# Patient Record
Sex: Male | Born: 1965 | Race: White | Hispanic: No | Smoking: Never smoker
Health system: Southern US, Community
[De-identification: ages and names within clinical notes are randomized; demographics above are authoritative.]

## PROBLEM LIST (undated history)

## (undated) DIAGNOSIS — B019 Varicella without complication: Secondary | ICD-10-CM

## (undated) DIAGNOSIS — C801 Malignant (primary) neoplasm, unspecified: Secondary | ICD-10-CM

## (undated) DIAGNOSIS — I82402 Acute embolism and thrombosis of unspecified deep veins of left lower extremity: Secondary | ICD-10-CM

## (undated) DIAGNOSIS — I2699 Other pulmonary embolism without acute cor pulmonale: Secondary | ICD-10-CM

## (undated) DIAGNOSIS — D689 Coagulation defect, unspecified: Secondary | ICD-10-CM

## (undated) DIAGNOSIS — G4730 Sleep apnea, unspecified: Secondary | ICD-10-CM

## (undated) DIAGNOSIS — I739 Peripheral vascular disease, unspecified: Secondary | ICD-10-CM

## (undated) DIAGNOSIS — I10 Essential (primary) hypertension: Secondary | ICD-10-CM

## (undated) DIAGNOSIS — M109 Gout, unspecified: Secondary | ICD-10-CM

## (undated) HISTORY — DX: Other pulmonary embolism without acute cor pulmonale: I26.99

## (undated) HISTORY — PX: LASIK: SHX215

## (undated) HISTORY — PX: COLONOSCOPY: SHX174

## (undated) HISTORY — DX: Sleep apnea, unspecified: G47.30

## (undated) HISTORY — DX: Coagulation defect, unspecified: D68.9

## (undated) HISTORY — DX: Acute embolism and thrombosis of unspecified deep veins of left lower extremity: I82.402

## (undated) HISTORY — PX: HERNIA REPAIR: SHX51

## (undated) HISTORY — PX: WISDOM TOOTH EXTRACTION: SHX21

## (undated) HISTORY — DX: Varicella without complication: B01.9

---

## 1994-10-13 HISTORY — PX: NASAL SEPTUM SURGERY: SHX37

## 2000-10-13 HISTORY — PX: CHOLECYSTECTOMY: SHX55

## 2001-10-11 ENCOUNTER — Observation Stay (HOSPITAL_COMMUNITY): Admission: EM | Admit: 2001-10-11 | Discharge: 2001-10-12 | Payer: Self-pay | Admitting: Emergency Medicine

## 2001-10-11 ENCOUNTER — Encounter (INDEPENDENT_AMBULATORY_CARE_PROVIDER_SITE_OTHER): Payer: Self-pay | Admitting: Specialist

## 2001-10-11 ENCOUNTER — Encounter: Payer: Self-pay | Admitting: Emergency Medicine

## 2001-10-13 HISTORY — PX: APPENDECTOMY: SHX54

## 2002-05-17 ENCOUNTER — Emergency Department (HOSPITAL_COMMUNITY): Admission: EM | Admit: 2002-05-17 | Discharge: 2002-05-18 | Payer: Self-pay | Admitting: Emergency Medicine

## 2002-10-11 ENCOUNTER — Encounter (INDEPENDENT_AMBULATORY_CARE_PROVIDER_SITE_OTHER): Payer: Self-pay | Admitting: Specialist

## 2002-10-12 ENCOUNTER — Encounter: Payer: Self-pay | Admitting: Family Medicine

## 2002-10-12 ENCOUNTER — Inpatient Hospital Stay (HOSPITAL_COMMUNITY): Admission: EM | Admit: 2002-10-12 | Discharge: 2002-10-13 | Payer: Self-pay | Admitting: Emergency Medicine

## 2005-07-31 ENCOUNTER — Ambulatory Visit: Payer: Self-pay | Admitting: Family Medicine

## 2005-08-01 ENCOUNTER — Encounter: Admission: RE | Admit: 2005-08-01 | Discharge: 2005-08-01 | Payer: Self-pay | Admitting: Family Medicine

## 2005-09-15 ENCOUNTER — Ambulatory Visit: Payer: Self-pay | Admitting: Family Medicine

## 2007-06-05 ENCOUNTER — Emergency Department (HOSPITAL_COMMUNITY): Admission: EM | Admit: 2007-06-05 | Discharge: 2007-06-05 | Payer: Self-pay | Admitting: Emergency Medicine

## 2008-11-18 ENCOUNTER — Emergency Department (HOSPITAL_BASED_OUTPATIENT_CLINIC_OR_DEPARTMENT_OTHER): Admission: EM | Admit: 2008-11-18 | Discharge: 2008-11-18 | Payer: Self-pay | Admitting: Emergency Medicine

## 2009-11-07 ENCOUNTER — Encounter: Admission: RE | Admit: 2009-11-07 | Discharge: 2010-02-05 | Payer: Self-pay | Admitting: Family Medicine

## 2011-01-22 ENCOUNTER — Emergency Department (HOSPITAL_BASED_OUTPATIENT_CLINIC_OR_DEPARTMENT_OTHER)
Admission: EM | Admit: 2011-01-22 | Discharge: 2011-01-22 | Disposition: A | Discharge status: Home / Self Care | Payer: BLUE CROSS/BLUE SHIELD | Attending: Emergency Medicine | Admitting: Emergency Medicine

## 2011-01-22 ENCOUNTER — Emergency Department (INDEPENDENT_AMBULATORY_CARE_PROVIDER_SITE_OTHER): Payer: BLUE CROSS/BLUE SHIELD

## 2011-01-22 DIAGNOSIS — M7020 Olecranon bursitis, unspecified elbow: Secondary | ICD-10-CM | POA: Insufficient documentation

## 2011-01-22 DIAGNOSIS — M25529 Pain in unspecified elbow: Secondary | ICD-10-CM | POA: Insufficient documentation

## 2011-02-28 NOTE — Op Note (Signed)
[facility]  Patient:    Jonathon [name], Jonathon [name] Visit Number: [id number] MRN: [id number]          Service Type: SUR Location: 3W [id number] [id number] Attending Physician:  Delsa Bern Dictated by:   Lorne Skeens. Hoxworth, M.D. Proc. Date: 10/11/01 Admit Date:  10/11/2001                             Operative Report  PREOPERATIVE DIAGNOSES: 1. Cholelithiasis. 2. Acute cholecystitis.  POSTOPERATIVE DIAGNOSES: 1. Cholelithiasis. 2. Acute cholecystitis.  SURGICAL PROCEDURES:  Laparoscopic cholecystectomy.  SURGEON:  Lorne Skeens. Hoxworth, M.D.  ASSISTANTRiley Lam A. Magnus Ivan, M.D.  ANESTHESIA:  General.  BRIEF HISTORY:  [name] is a 45 year old male, who presents with acute back and right upper quadrant abdominal pain of approximately 12 hours duration.  Ultrasound has shown gallstones.  White count is elevated, and he remains moderately tender.  Urgent laparoscopic cholecystectomy has been recommended and accepted.  The nature of the procedure, its indications, risks of bleeding, infection, bile leak and bile duct injury were discussed and understood preoperatively.  He is now brought to the operating room for this procedure.  DESCRIPTION OF OPERATION:  The patient brought to the operating room and placed in the supine position on the operating table, and general endotracheal anesthesia was induced.  He received preoperative antibiotics.  PAS were in place.  The abdomen was sterilely prepped and draped.  Local anesthesia was used to infiltrate the trocar sites prior to the incisions.  A 1 cm incision was made at the umbilicus and dissection carried down the midline fascia which was sharply incised for 1 cm and the peritoneum entered under direct vision. Through a mattress suture of 0 Vicryl, the Hasson trocar was placed and pneumoperitoneum established.  Under direct vision, a 10 mm trocar was placed in the subxiphoid area and two 5  mm trocars on the right subcostal margin. The gallbladder was visualized and was acutely inflamed and edematous with early gangrenous changes.  The fundus was grasped and elevated up over the liver, and the infundibulum retracted inferolaterally.  Edematous fibrofatty tissue was stripped off the neck of the gallbladder toward the porta hepatis. The distal gallbladder was exposed, and the cystic duct identified and the cystic duct gallbladder junction dissected 360 degrees.  When the anatomy was clear, the cystic duct was doubly clipped proximally, clipped distally and divided.  Further dissection of Calots triangle revealed the cystic artery coursing up on the gallbladder wall and was divided between two proximal and one distal clips.  The gallbladder was then dissected free from its bed using hook cautery and removed through the umbilicus.  Hemostasis was assured in the gallbladder bed.  The right upper quadrant was thoroughly irrigated and suctioned until clear.  The trocars removed under direct vision and all CO2 evacuated from the peritoneal cavity.  The pursestring suture was secured at the umbilicus.  Skin incisions were closed with interrupted subcuticular 4-0 Monocryl and Steri-Strips.  Sponge, needle, and instrument counts were correct.  Dry sterile dressings applied and the patient was taken to recovery in satisfactory condition.procedure well. Dictated by:   Lorne Skeens. Hoxworth, M.D. Attending Physician:  Delsa Bern DD:  10/11/01 TD:  10/11/01 Job: [id number]

## 2011-02-28 NOTE — Op Note (Signed)
[name], [name]                      ACCOUNT NO.:  [id number]   MEDICAL RECORD NO.:  [id number]                   PATIENT TYPE:  INP   LOCATION:  1829                                 FACILITY:  [facility]   PHYSICIAN:  Thornton Park. Daphine Deutscher, M.D.             DATE OF BIRTH:  05/25/1966   DATE OF PROCEDURE:  DATE OF DISCHARGE:                                 OPERATIVE REPORT   PREOPERATIVE DIAGNOSIS:  Acute appendicitis.   POSTOPERATIVE DIAGNOSES:  Acute appendicitis.   PROCEDURE:  Laparoscopically appendectomy.   SURGEON:  Thornton Park. Daphine Deutscher, M.D.   ANESTHESIA:  General endotracheal anesthesia.   INDICATIONS FOR PROCEDURE:  The patient is a 45 year-old gentleman sent over  from Dr. Alonza Smoker this morning with about a nine hour history of right  lower quadrant abdominal pain.  The patient was seen in the ER where his  white count was noted to be 12,000 and he had pain at McBurney's point.  Informed consent was obtained regarding laparoscopic as well as open  appendectomy.   DESCRIPTION OF PROCEDURE:  The patient was taken to room 17 emergently at  [name] and given general anesthesia.  The abdomen was prepped  with Betadine and draped sterilely.  Preoperatively he received 3 grams of  Unasyn.  I incised a previous transverse incision in the umbilicus and  entered the abdomen without difficulty using a Hasson cannula.  The abdomen  was insufflated and the appendix area was visualized.  The appendix had a  picture taken of it which is in the chart which showed it to be purple with  pus surrounding it.  It did not appear to be perforated, however. A 5 mm was  placed in the right upper quadrant through a previous old laparoscopic  cholecystectomy incision and then a 10-11 in the left lower quadrant.  The  10-11 in the left lower quadrant was placed obliquely with a non-bladed  trocar.  The scope was moved to the left lower quadrant and I operated out  of the umbilical  port and the right upper quadrant port.  With these, I went  ahead and mobilized the appendix and transected the base with the vascular  Endo GIA.  I then transected the mesentery with the vascular Endo GIA and  then an arterial bleeder was noted along the staple line.  This was grasped  with the harmonic scalpel and was coagulated.  In addition, two clips were  placed on this.  No bleeding was then seen and I irrigated copiously and  went back and examined this area at the end of the case.   In the meantime, I went ahead and placed the appendix in a bag and brought  it out through the umbilicus without difficulty.   The port sites were injected with 0.5% Marcaine.  The umbilical port was  repaired with 2-0 Vicryl sutures placed horizontally through the fascia.  This  area was then inspected through the left lower quadrant port and a good  closure was present.  I reinspected the appendiceal region once again and  also withdrew any irrigant that had built up by placing the patient with the  head up and aspirating the pelvis.   The abdomen was then deflated and the two remaining trocars were withdrawn.  The wounds were closed with 4-0 Vicryl subcuticularly with Benzoin and Steri-  Strips.  The patient tolerated the procedure well and was taken to the  recovery room in satisfactory condition.                                               Thornton Park Daphine Deutscher, M.D.    MBM/MEDQ  D:  10/12/2002  T:  10/12/2002  Job:  [id number]   cc:   Tinnie Gens A. Tawanna Cooler, M.D. [facility]  [address] [place]  [place] [address]  Fax: 1

## 2016-07-18 ENCOUNTER — Encounter (HOSPITAL_COMMUNITY): Payer: Self-pay

## 2016-07-18 ENCOUNTER — Inpatient Hospital Stay (HOSPITAL_COMMUNITY)
Admission: EM | Admit: 2016-07-18 | Discharge: 2016-07-20 | DRG: 175 | Disposition: A | Discharge status: Home / Self Care | Payer: Commercial Managed Care - HMO | Attending: Internal Medicine | Admitting: Internal Medicine

## 2016-07-18 ENCOUNTER — Emergency Department (HOSPITAL_COMMUNITY): Payer: Commercial Managed Care - HMO

## 2016-07-18 DIAGNOSIS — E6609 Other obesity due to excess calories: Secondary | ICD-10-CM | POA: Diagnosis not present

## 2016-07-18 DIAGNOSIS — Z6838 Body mass index (BMI) 38.0-38.9, adult: Secondary | ICD-10-CM

## 2016-07-18 DIAGNOSIS — I2602 Saddle embolus of pulmonary artery with acute cor pulmonale: Secondary | ICD-10-CM | POA: Diagnosis not present

## 2016-07-18 DIAGNOSIS — E669 Obesity, unspecified: Secondary | ICD-10-CM | POA: Diagnosis present

## 2016-07-18 DIAGNOSIS — E860 Dehydration: Secondary | ICD-10-CM | POA: Diagnosis present

## 2016-07-18 DIAGNOSIS — Z79899 Other long term (current) drug therapy: Secondary | ICD-10-CM

## 2016-07-18 DIAGNOSIS — G4733 Obstructive sleep apnea (adult) (pediatric): Secondary | ICD-10-CM | POA: Diagnosis present

## 2016-07-18 DIAGNOSIS — M109 Gout, unspecified: Secondary | ICD-10-CM | POA: Diagnosis not present

## 2016-07-18 DIAGNOSIS — I10 Essential (primary) hypertension: Secondary | ICD-10-CM | POA: Diagnosis not present

## 2016-07-18 DIAGNOSIS — I2699 Other pulmonary embolism without acute cor pulmonale: Secondary | ICD-10-CM

## 2016-07-18 DIAGNOSIS — R0602 Shortness of breath: Secondary | ICD-10-CM

## 2016-07-18 DIAGNOSIS — I82412 Acute embolism and thrombosis of left femoral vein: Secondary | ICD-10-CM | POA: Diagnosis not present

## 2016-07-18 DIAGNOSIS — R7989 Other specified abnormal findings of blood chemistry: Secondary | ICD-10-CM | POA: Diagnosis not present

## 2016-07-18 DIAGNOSIS — Z86711 Personal history of pulmonary embolism: Secondary | ICD-10-CM | POA: Diagnosis present

## 2016-07-18 HISTORY — DX: Other pulmonary embolism without acute cor pulmonale: I26.99

## 2016-07-18 HISTORY — DX: Gout, unspecified: M10.9

## 2016-07-18 LAB — BASIC METABOLIC PANEL
Anion gap: 9 (ref 5–15)
BUN: 19 mg/dL (ref 6–20)
CALCIUM: 9.5 mg/dL (ref 8.9–10.3)
CO2: 25 mmol/L (ref 22–32)
Chloride: 106 mmol/L (ref 101–111)
Creatinine, Ser: 1.11 mg/dL (ref 0.61–1.24)
GFR calc Af Amer: >60 mL/min (ref >60)
GLUCOSE: 90 mg/dL (ref 65–99)
Potassium: 3.9 mmol/L (ref 3.5–5.1)
Sodium: 140 mmol/L (ref 135–145)

## 2016-07-18 LAB — TROPONIN I: Troponin I: <0.03 ng/mL (ref <0.03)

## 2016-07-18 LAB — CBC
HCT: 44.8 % (ref 39.0–52.0)
HEMOGLOBIN: 15.1 g/dL (ref 13.0–17.0)
MCH: 30.3 pg (ref 26.0–34.0)
MCHC: 33.7 g/dL (ref 30.0–36.0)
MCV: 90 fL (ref 78.0–100.0)
Platelets: 174 10*3/uL (ref 150–400)
RBC: 4.98 MIL/uL (ref 4.22–5.81)
RDW: 13.3 % (ref 11.5–15.5)
WBC: 7.6 10*3/uL (ref 4.0–10.5)

## 2016-07-18 LAB — PROTIME-INR
INR: 0.89
PROTHROMBIN TIME: 12.1 s (ref 11.4–15.2)

## 2016-07-18 LAB — BRAIN NATRIURETIC PEPTIDE: B NATRIURETIC PEPTIDE 5: 10 pg/mL (ref 0.0–100.0)

## 2016-07-18 LAB — D-DIMER, QUANTITATIVE: D-Dimer, Quant: 5.29 ug/mL-FEU — ABNORMAL HIGH (ref 0.00–0.50)

## 2016-07-18 LAB — I-STAT TROPONIN, ED: TROPONIN I, POC: 0 ng/mL (ref 0.00–0.08)

## 2016-07-18 MED ORDER — IOPAMIDOL (ISOVUE-370) INJECTION 76%
100.0000 mL | Freq: Once | INTRAVENOUS | Status: AC | PRN
  Administered 2016-07-18: 100 mL via INTRAVENOUS

## 2016-07-18 MED ORDER — HEPARIN BOLUS VIA INFUSION
5000.0000 [IU] | Freq: Once | INTRAVENOUS | Status: AC
  Administered 2016-07-18: 5000 [IU] via INTRAVENOUS
  Filled 2016-07-18: qty 5000

## 2016-07-18 MED ORDER — HEPARIN (PORCINE) IN NACL 100-0.45 UNIT/ML-% IJ SOLN
1700.0000 [IU]/h | INTRAMUSCULAR | Status: DC
  Administered 2016-07-18 – 2016-07-20 (×3): 1700 [IU]/h via INTRAVENOUS
  Filled 2016-07-18 (×3): qty 250

## 2016-07-18 NOTE — Progress Notes (Signed)
ANTICOAGULATION CONSULT NOTE - Initial Consult  Pharmacy Consult for Heparin Indication: pulmonary embolus  No Known Allergies  Patient Measurements: Height: 5\' 10"  (177.8 cm) Weight: 239 lb (108.4 kg) IBW/kg (Calculated) : 73 HEPARIN DW (KG): 96.4  Vital Signs: Temp: 98.3 F (36.8 C) (10/06 1923) Temp Source: Oral (10/06 1923) BP: 142/93 (10/06 1923) Pulse Rate: 85 (10/06 1923)  Labs:  Recent Labs  07/18/16 1627  HGB 15.1  HCT 44.8  PLT 174  CREATININE 1.11  TROPONINI <0.03    Estimated Creatinine Clearance: 98.2 mL/min (by C-G formula based on SCr of 1.11 mg/dL).   Medical History: Past Medical History:  Diagnosis Date  . Gout     Medications:  Infusions:   Assessment: 50 yo M with new PE.  CBC reviewed.    Goal of Therapy:  Heparin level 0.3-0.7 units/ml Monitor platelets by anticoagulation protocol: Yes   Plan:  Give 5000 units bolus x 1 Start heparin infusion at 1700 units/hr Check anti-Xa level in 6 hours and daily while on heparin Continue to monitor H&H and platelets  Leveta Wahab, Lavonia Drafts 07/18/2016,9:57 PM

## 2016-07-18 NOTE — ED Triage Notes (Signed)
Pt presents to the ED c/o SOB and dizziness on exertion x 2-3 weeks. Pt reports he becomes SOB after walking up the flights of stairs on his way to work. Denies CP or syncope. NAD noted at this time.

## 2016-07-18 NOTE — ED Provider Notes (Addendum)
[facility] DEPT Provider Note   CSN: [id number] Arrival date & time: 07/18/16  1428     History   Chief Complaint No chief complaint on file.   HPI ZINEDINE MAJID is a 50 y.o. male.  The history is provided by the patient.  Shortness of Breath  This is a new problem. The average episode lasts 2 weeks. The problem occurs frequently.The problem has been gradually worsening. Associated symptoms include leg swelling. Pertinent negatives include no fever, no sore throat, no ear pain, no cough, no sputum production, no chest pain, no vomiting, no abdominal pain and no rash. It is unknown what precipitated the problem. Risk factors include recent prolonged sitting. He has tried nothing for the symptoms. Associated medical issues do not include asthma, COPD, pneumonia, PE, CAD, heart failure, past MI or DVT.   Reports travel in May resulting in LLE swelling that was seen by PCP, who thought it might be gout and given steroids that improved swelling. No DVT US obtained. Also reports multiple long car rides of approx 10hrs in the last several months   Past Medical History:  Diagnosis Date  . Gout     Patient Active Problem List   Diagnosis Date Noted  . Pulmonary embolism ([place]) 07/18/2016    History reviewed. No pertinent surgical history.     Home Medications    Prior to Admission medications   Medication Sig Start Date End Date Taking? Authorizing Provider  allopurinol (ZYLOPRIM) 300 MG tablet Take 300 mg by mouth daily.   Yes Historical Provider, MD  naproxen sodium (ANAPROX) 220 MG tablet Take 440 mg by mouth 2 (two) times daily as needed (pain).   Yes Historical Provider, MD    Family History Family History  Problem Relation Age of Onset  . Heart attack Father     Social History Social History  Substance Use Topics  . Smoking status: Never Smoker  . Smokeless tobacco: Never Used  . Alcohol use 3.0 - 3.6 oz/week    5 - 6 Cans of beer per week      Allergies   Review of patient's allergies indicates no known allergies.   Review of Systems Review of Systems  Constitutional: Negative for chills and fever.  HENT: Negative for ear pain and sore throat.   Eyes: Negative for pain and visual disturbance.  Respiratory: Positive for shortness of breath. Negative for cough and sputum production.   Cardiovascular: Positive for leg swelling. Negative for chest pain and palpitations.  Gastrointestinal: Negative for abdominal pain and vomiting.  Genitourinary: Negative for dysuria and hematuria.  Musculoskeletal: Negative for arthralgias and back pain.  Skin: Negative for color change and rash.  Neurological: Negative for seizures and syncope.  All other systems reviewed and are negative.    Physical Exam Updated Vital Signs BP 142/100 (BP Location: Right Arm)   Pulse 84   Temp 98.3 F (36.8 C) (Oral)   Resp 20   Ht 5\' 10"  (1.778 m)   Wt 239 lb (108.4 kg)   SpO2 95%   BMI 34.29 kg/m   Physical Exam  Constitutional: He is oriented to person, place, and time. He appears well-developed and well-nourished. No distress.  HENT:  Head: Normocephalic and atraumatic.  Nose: Nose normal.  Eyes: Conjunctivae and EOM are normal. Pupils are equal, round, and reactive to light. Right eye exhibits no discharge. Left eye exhibits no discharge. No scleral icterus.  Neck: Normal range of motion. Neck supple.  Cardiovascular: Normal rate  and regular rhythm.  Exam reveals no gallop and no friction rub.   No murmur heard. Pulmonary/Chest: Effort normal and breath sounds normal. No stridor. No respiratory distress. He has no rales.  Abdominal: Soft. He exhibits no distension. There is no tenderness.  Musculoskeletal: He exhibits no edema or tenderness.  BLE edema L>R  Neurological: He is alert and oriented to person, place, and time.  Skin: Skin is warm and dry. No rash noted. He is not diaphoretic. No erythema.  Psychiatric: He has a  normal mood and affect.  Vitals reviewed.    ED Treatments / Results  Labs (all labs ordered are listed, but only abnormal results are displayed) Labs Reviewed  D-DIMER, QUANTITATIVE (NOT AT [facility]) - Abnormal; Notable for the following:       Result Value   D-Dimer, Quant 5.29 (*)    All other components within normal limits  BASIC METABOLIC PANEL  CBC  TROPONIN I  BRAIN NATRIURETIC PEPTIDE  CBC  PROTIME-INR  I-STAT TROPOININ, ED    EKG  EKG Interpretation None       Radiology Dg Chest 2 View  Result Date: 07/18/2016 CLINICAL DATA:  Initial evaluation for acute shortness of breath, dizziness on exertion. EXAM: CHEST  2 VIEW COMPARISON:  None available. FINDINGS: The heart size and mediastinal contours are within normal limits. Both lungs are clear. The visualized skeletal structures are unremarkable. IMPRESSION: No active cardiopulmonary disease. Electronically Signed   By: Jeannine Boga M.D.   On: 07/18/2016 18:51   Ct Angio Chest Pe W Or Wo Contrast  Result Date: 07/18/2016 CLINICAL DATA:  Shortness of breath and dizziness. EXAM: CT ANGIOGRAPHY CHEST WITH CONTRAST TECHNIQUE: Multidetector CT imaging of the chest was performed using the standard protocol during bolus administration of intravenous contrast. Multiplanar CT image reconstructions and MIPs were obtained to evaluate the vascular anatomy. CONTRAST:  100 mL Isovue 370 IV COMPARISON:  Chest radiograph 07/18/2016 FINDINGS: Cardiovascular: There is satisfactory opacification of the pulmonary arteries to the segmental level. There are bilateral pulmonary emboli within the right and left pulmonary arteries, extending into the upper and lower lobar and segmental arteries on the right and into the upper lobar and posterior upper lobe segmental, lingular and lower lobar and segmental arteries on the right. The main pulmonary artery is within normal limits for size, measuring 2.7 cm at the bifurcation. The RV/LV ratio is  calculated to be 0.9, suggesting right heart strain. The visualized aorta is normal. There is a normal 3-vessel arch branching pattern. Heart size is normal, without pericardial effusion. Mediastinum/Nodes: No mediastinal, hilar or axillary lymphadenopathy. The visualized thyroid and thoracic esophageal course are unremarkable. Lungs/Pleura: Lungs are clear. No pleural effusion or pneumothorax. Upper Abdomen: Contrast bolus timing is not optimized for evaluation of the abdominal organs. Within this limitation, the visualized organs of the upper abdomen are normal. Musculoskeletal: No chest wall abnormality. No acute or significant osseous findings. Review of the MIP images confirms the above findings. IMPRESSION: Acute pulmonary embolism within the bilateral pulmonary artery's, extending into the lobar and segmental branches with CT evidence of right heart strain (RV/LV Ratio = 0.9) consistent with at least submassive (intermediate risk) PE. The presence of right heart strain has been associated with an increased risk of morbidity and mortality. Please activate Code PE by paging [phone]. Critical Value/emergent results were called by telephone at the time of interpretation on 07/18/2016 at 10:15 pm to Dr. Addison Lank , who verbally acknowledged these results. Electronically Signed  By: Ulyses Jarred M.D.   On: 07/18/2016 22:18    Procedures Procedures (including critical care time)  CRITICAL CARE Performed by: Grayce Sessions Leeum Sankey Total critical care time: 40 minutes Critical care time was exclusive of separately billable procedures and treating other patients. Critical care was necessary to treat or prevent imminent or life-threatening deterioration. Critical care was time spent personally by me on the following activities: development of treatment plan with patient and/or surrogate as well as nursing, discussions with consultants, evaluation of patient's response to treatment, examination of  patient, obtaining history from patient or surrogate, ordering and performing treatments and interventions, ordering and review of laboratory studies, ordering and review of radiographic studies, pulse oximetry and re-evaluation of patient's condition.   Medications Ordered in ED Medications  heparin bolus via infusion 5,000 Units (not administered)  heparin ADULT infusion 100 units/mL (25000 units/264mL sodium chloride 0.45%) (not administered)  iopamidol (ISOVUE-370) 76 % injection 100 mL (100 mLs Intravenous Contrast Given 07/18/16 2132)     Initial Impression / Assessment and Plan / ED Course  I have reviewed the triage vital signs and the nursing notes.  Pertinent labs & imaging results that were available during my care of the patient were reviewed by me and considered in my medical decision making (see chart for details).  Clinical Course  Value Comment By Time  D-Dimer, Quant: (!) 5.29 (Reviewed) Fatima Blank, MD 10/06 2019    EKG with S1Q3T3. CT confirmed bilateral pulmonary emboli with possible right heart strain. Patient's blood pressure and saturations are stable. Started on heparin gtt. Will require admission for continued workup and management.  Appreciate hospitalist admission.  Final Clinical Impressions(s) / ED Diagnoses   Final diagnoses:  SOB (shortness of breath)  Acute saddle pulmonary embolism with acute cor pulmonale ([place])    Disposition: Admit  Condition: stable    Fatima Blank, MD 07/19/16 1259

## 2016-07-18 NOTE — ED Notes (Signed)
Pt reports having a lack of energy in the late afternoon for last 2-3 weeks.

## 2016-07-18 NOTE — Progress Notes (Signed)
Patient listed as having [employer] insurance without a pcp.  EDCM spoke to patient at bedside.  Patient confirms he does not have a pcp.  EDCM provided patient with list of providers who accept [facility] insurance within within a 20 mile radius of patient's zip code.  Patient thankful for resources.  No further EDCm needs at this time.

## 2016-07-18 NOTE — H&P (Signed)
Jonathon [name] [id number] DOB: June 24, 1966 DOA: 07/18/2016     [id number] none Outpatient Specialists: none   Patient coming from:   home Lives   With family    Chief Complaint: leg swelling and shortness of breath  HPI: Jonathon [name] is a 50 y.o. male with medical history significant of gout and OSA.     Presented with patient has been having 2-3 weeks of shortness of breath and lightheadedness especially worse with exertion he was having trouble walking up the flight of stairs on his way to work. Patient discussed his symptoms with his coworker who had PE in the past and was recommended to present to emergency department which he did. Patient has had frequent travel he has traveled to [place] and has had some leg edema in May since he was seen by her primary care provider thought that this was likely gout since he has a history of gout. He was treated with prednisone with no relief. He denies any chest pain no syncope. He has had monthly driving trips and frequent flights.   Deneis any wheezing no back pain some chest tightness. He snores a lot He has done sleep study in the past and was diagnosed with sleep apnea but does not tolerate machine No blood in stools, no black stools no weight loss, no family hx of clotting disorder but father was on coumadin for unclear reason.  Patient never had colonoscopy  IN ER:  Temp (24hrs), Avg:98.3 F (36.8 C), Min:98.3 F (36.8 C), Max:98.3 F (36.8 C)   95% HR 72 SBP 150/104  Na 140 K 3.9 Cr 1.11  WBC  7.6   Hg15.1  D.dimer 5.29  Trop <0.03  CT bilateral PE Following Medications were ordered in ER: Medications  heparin bolus via infusion 5,000 Units (not administered)  heparin ADULT infusion 100 units/mL (25000 units/220mL sodium chloride 0.45%) (not administered)  iopamidol (ISOVUE-370) 76 % injection 100 mL (100 mLs Intravenous Contrast Given 07/18/16 2132)       Hospitalist was called for admission for  Submassive pulmonary embolism  Review of Systems:    Pertinent positives include: Lower extremity swelling shortness of breath at rest. dyspnea on exertion,   Constitutional:  No weight loss, night sweats, Fevers, chills, fatigue, weight loss  HEENT:  No headaches, Difficulty swallowing,Tooth/dental problems,Sore throat,  No sneezing, itching, ear ache, nasal congestion, post nasal drip,  Cardio-vascular:  No chest pain, Orthopnea, PND, anasarca, dizziness, palpitations.no Bilateral lower extremity swelling  GI:  No heartburn, indigestion, abdominal pain, nausea, vomiting, diarrhea, change in bowel habits, loss of appetite, melena, blood in stool, hematemesis Resp:    No excess mucus, no productive cough, No non-productive cough, No coughing up of blood.No change in color of mucus.No wheezing. Skin:  no rash or lesions. No jaundice GU:  no dysuria, change in color of urine, no urgency or frequency. No straining to urinate.  No flank pain.  Musculoskeletal:  No joint pain or no joint swelling. No decreased range of motion. No back pain.  Psych:  No change in mood or affect. No depression or anxiety. No memory loss.  Neuro: no localizing neurological complaints, no tingling, no weakness, no double vision, no gait abnormality, no slurred speech, no confusion  As per HPI otherwise 10 point review of systems negative.   Past Medical History: Past Medical History:  Diagnosis Date  . Gout    History reviewed. No pertinent surgical history.   Social History:  Ambulatory  independently      reports that he has never smoked. He has never used smokeless tobacco. He reports that he drinks about 3.0 - 3.6 oz of alcohol per week . He reports that he does not use drugs.  Allergies:  No Known Allergies     Family History:   Family History  Problem Relation Age of Onset  . Heart attack Father   . Hypercalcemia Father   . Diabetes Father   . Uterine cancer Mother   . Stroke  Other     Medications: Prior to Admission medications   Medication Sig Start Date End Date Taking? Authorizing Provider  allopurinol (ZYLOPRIM) 300 MG tablet Take 300 mg by mouth daily.   Yes Historical Provider, MD  naproxen sodium (ANAPROX) 220 MG tablet Take 440 mg by mouth 2 (two) times daily as needed (pain).   Yes Historical Provider, MD    Physical Exam: Patient Vitals for the past 24 hrs:  BP Temp Temp src Pulse Resp SpO2 Height Weight  07/18/16 1923 142/93 98.3 F (36.8 C) Oral 85 20 94 % - -  07/18/16 1710 142/100 - - 84 20 95 % - -  07/18/16 1447 - - - - - - 5\' 10"  (1.778 m) 108.4 kg (239 lb)  07/18/16 1439 160/98 98.3 F (36.8 C) Oral 75 18 95 % 5\' 10"  (1.778 m) 108.4 kg (239 lb)    1. General:  in No Acute distress 2. Psychological: Alert and Oriented 3. Head/ENT:    Dry Mucous Membranes                          Head Non traumatic, neck supple                          Normal Dentition 4. SKIN: normal  Skin turgor,  Skin clean Dry and intact no rash 5. Heart: Regular rate and rhythm no Murmur, Rub or gallop 6. Lungs: , no wheezes or crackles   7. Abdomen: Soft,  non-tender, Non distended 8. Lower extremities: no clubbing, cyanosis, or edema 9. Neurologically Grossly intact, moving all 4 extremities equally  10. MSK: Normal range of motion   body mass index is 34.29 kg/m.  Labs on Admission:   Labs on Admission: I have personally reviewed following labs and imaging studies  CBC:  Recent Labs Lab 07/18/16 1627  WBC 7.6  HGB 15.1  HCT 44.8  MCV 90.0  PLT [id number]   Basic Metabolic Panel:  Recent Labs Lab 07/18/16 1627  NA 140  K 3.9  CL 106  CO2 25  GLUCOSE 90  BUN 19  CREATININE 1.11  CALCIUM 9.5   GFR: Estimated Creatinine Clearance: 98.2 mL/min (by C-G formula based on SCr of 1.11 mg/dL). Liver Function Tests: No results for input(s): AST, ALT, ALKPHOS, BILITOT, PROT, ALBUMIN in the last 168 hours. No results for input(s): LIPASE, AMYLASE  in the last 168 hours. No results for input(s): AMMONIA in the last 168 hours. Coagulation Profile: No results for input(s): INR, PROTIME in the last 168 hours. Cardiac Enzymes:  Recent Labs Lab 07/18/16 1627  TROPONINI <0.03   BNP (last 3 results) No results for input(s): PROBNP in the last 8760 hours. HbA1C: No results for input(s): HGBA1C in the last 72 hours. CBG: No results for input(s): GLUCAP in the last 168 hours. Lipid Profile: No results for input(s): CHOL, HDL, LDLCALC, TRIG, CHOLHDL, LDLDIRECT  in the last 72 hours. Thyroid Function Tests: No results for input(s): TSH, T4TOTAL, FREET4, T3FREE, THYROIDAB in the last 72 hours. Anemia Panel: No results for input(s): VITAMINB12, FOLATE, FERRITIN, TIBC, IRON, RETICCTPCT in the last 72 hours. Urine analysis: No results found for: COLORURINE, APPEARANCEUR, LABSPEC, PHURINE, GLUCOSEU, HGBUR, BILIRUBINUR, KETONESUR, PROTEINUR, UROBILINOGEN, NITRITE, LEUKOCYTESUR Sepsis Labs: @LABRCNTIP (procalcitonin:4,lacticidven:4) )No results found for this or any previous visit (from the past 240 hour(s)).    UA   ordered  No results found for: HGBA1C  Estimated Creatinine Clearance: 98.2 mL/min (by C-G formula based on SCr of 1.11 mg/dL).  BNP (last 3 results) No results for input(s): PROBNP in the last 8760 hours.   ECG REPORT  Independently reviewed Rate: 83  Rhythm: Normal sinus rhythm left posterior fascicular block ST&T Change: No acute ischemic changes   QTC 433  Filed Weights   07/18/16 1439 07/18/16 1447  Weight: 108.4 kg (239 lb) 108.4 kg (239 lb)     Cultures: No results found for: SDES, SPECREQUEST, CULT, REPTSTATUS   Radiological Exams on Admission: Dg Chest 2 View  Result Date: 07/18/2016 CLINICAL DATA:  Initial evaluation for acute shortness of breath, dizziness on exertion. EXAM: CHEST  2 VIEW COMPARISON:  None available. FINDINGS: The heart size and mediastinal contours are within normal limits. Both  lungs are clear. The visualized skeletal structures are unremarkable. IMPRESSION: No active cardiopulmonary disease. Electronically Signed   By: Jeannine Boga M.D.   On: 07/18/2016 18:51   Ct Angio Chest Pe W Or Wo Contrast  Result Date: 07/18/2016 CLINICAL DATA:  Shortness of breath and dizziness. EXAM: CT ANGIOGRAPHY CHEST WITH CONTRAST TECHNIQUE: Multidetector CT imaging of the chest was performed using the standard protocol during bolus administration of intravenous contrast. Multiplanar CT image reconstructions and MIPs were obtained to evaluate the vascular anatomy. CONTRAST:  100 mL Isovue 370 IV COMPARISON:  Chest radiograph 07/18/2016 FINDINGS: Cardiovascular: There is satisfactory opacification of the pulmonary arteries to the segmental level. There are bilateral pulmonary emboli within the right and left pulmonary arteries, extending into the upper and lower lobar and segmental arteries on the right and into the upper lobar and posterior upper lobe segmental, lingular and lower lobar and segmental arteries on the right. The main pulmonary artery is within normal limits for size, measuring 2.7 cm at the bifurcation. The RV/LV ratio is calculated to be 0.9, suggesting right heart strain. The visualized aorta is normal. There is a normal 3-vessel arch branching pattern. Heart size is normal, without pericardial effusion. Mediastinum/Nodes: No mediastinal, hilar or axillary lymphadenopathy. The visualized thyroid and thoracic esophageal course are unremarkable. Lungs/Pleura: Lungs are clear. No pleural effusion or pneumothorax. Upper Abdomen: Contrast bolus timing is not optimized for evaluation of the abdominal organs. Within this limitation, the visualized organs of the upper abdomen are normal. Musculoskeletal: No chest wall abnormality. No acute or significant osseous findings. Review of the MIP images confirms the above findings. IMPRESSION: Acute pulmonary embolism within the bilateral  pulmonary artery's, extending into the lobar and segmental branches with CT evidence of right heart strain (RV/LV Ratio = 0.9) consistent with at least submassive (intermediate risk) PE. The presence of right heart strain has been associated with an increased risk of morbidity and mortality. Please activate Code PE by paging [phone]. Critical Value/emergent results were called by telephone at the time of interpretation on 07/18/2016 at 10:15 pm to Dr. Addison Lank , who verbally acknowledged these results. Electronically Signed   By: Cletus Gash.D.  On: 07/18/2016 22:18    Chart has been reviewed    Assessment/Plan  50 y.o. male with medical history significant of gout and OSA. Being admitted for bilateral PE in the setting of travel sedentary position  Present on Admission: . Pulmonary embolism ([place]) - admit to telemetry obtain echogram, lower extremity Dopplers, currently on heparin drip will need long-term anticoagulation for at least 3 months care Management consult ordered Gout currently stable Other plan as per orders.  DVT prophylaxis:  Heparin  Code Status:  FULL CODE  as per patient    Family Communication:   Family not at  Bedside  plan of care was discussed with  Wife,   Disposition Plan:     To home once workup is complete and patient is stable      Admission status:   inpatient     Level of care   tele           I have spent a total of 56 min on this admission   Kariyah Baugh 07/18/2016, 11:08 PM    [facility]  Pager [phone]   after 2 AM please page floor coverage PA If 7AM-7PM, please contact the day team taking care of the patient  [email or web address]  Password [email or web address]

## 2016-07-19 ENCOUNTER — Inpatient Hospital Stay (HOSPITAL_COMMUNITY): Payer: Commercial Managed Care - HMO

## 2016-07-19 DIAGNOSIS — R7989 Other specified abnormal findings of blood chemistry: Secondary | ICD-10-CM

## 2016-07-19 DIAGNOSIS — Z6834 Body mass index (BMI) 34.0-34.9, adult: Secondary | ICD-10-CM

## 2016-07-19 DIAGNOSIS — G4733 Obstructive sleep apnea (adult) (pediatric): Secondary | ICD-10-CM

## 2016-07-19 DIAGNOSIS — I2699 Other pulmonary embolism without acute cor pulmonale: Secondary | ICD-10-CM

## 2016-07-19 DIAGNOSIS — E6609 Other obesity due to excess calories: Secondary | ICD-10-CM

## 2016-07-19 LAB — ECHOCARDIOGRAM COMPLETE
CHL CUP DOP CALC LVOT VTI: 21.5 cm
CHL CUP STROKE VOLUME: 52 mL
E/e' ratio: 4.27
EWDT: 366 ms
FS: 36 % (ref 28–44)
HEIGHTINCHES: 70 in
IVS/LV PW RATIO, ED: 1.12
LA ID, A-P, ES: 33 mm
LA vol A4C: 45.8 ml
LA vol index: 20.3 mL/m2
LA vol: 50.9 mL
LADIAMINDEX: 1.32 cm/m2
LEFT ATRIUM END SYS DIAM: 33 mm
LV PW d: 13.6 mm — AB (ref 0.6–1.1)
LV TDI E'LATERAL: 12.7
LV TDI E'MEDIAL: 6.74
LV dias vol index: 38 mL/m2
LV dias vol: 94 mL (ref 62–150)
LV e' LATERAL: 12.7 cm/s
LV sys vol index: 17 mL/m2
LV sys vol: 42 mL (ref 21–61)
LVEEAVG: 4.27
LVEEMED: 4.27
LVOT area: 3.8 cm2
LVOT diameter: 22 mm
LVOT peak vel: 95.2 cm/s
LVOTSV: 82 mL
Lateral S' vel: 14.3 cm/s
MV Dec: 366
MVPKAVEL: 58.8 m/s
MVPKEVEL: 54.2 m/s
Simpson's disk: 55
WEIGHTICAEL: 4297.6 [oz_av]

## 2016-07-19 LAB — COMPREHENSIVE METABOLIC PANEL
ALBUMIN: 4.1 g/dL (ref 3.5–5.0)
ALK PHOS: 78 U/L (ref 38–126)
ALT: 23 U/L (ref 17–63)
ANION GAP: 6 (ref 5–15)
AST: 19 U/L (ref 15–41)
BILIRUBIN TOTAL: 1.2 mg/dL (ref 0.3–1.2)
BUN: 16 mg/dL (ref 6–20)
CO2: 28 mmol/L (ref 22–32)
Calcium: 9.1 mg/dL (ref 8.9–10.3)
Chloride: 106 mmol/L (ref 101–111)
Creatinine, Ser: 1.25 mg/dL — ABNORMAL HIGH (ref 0.61–1.24)
GFR calc Af Amer: >60 mL/min (ref >60)
GFR calc non Af Amer: >60 mL/min (ref >60)
GLUCOSE: 106 mg/dL — AB (ref 65–99)
POTASSIUM: 4.3 mmol/L (ref 3.5–5.1)
SODIUM: 140 mmol/L (ref 135–145)
Total Protein: 7.1 g/dL (ref 6.5–8.1)

## 2016-07-19 LAB — URINALYSIS, ROUTINE W REFLEX MICROSCOPIC
BILIRUBIN URINE: NEGATIVE
Glucose, UA: NEGATIVE mg/dL
Hgb urine dipstick: NEGATIVE
KETONES UR: NEGATIVE mg/dL
LEUKOCYTES UA: NEGATIVE
NITRITE: NEGATIVE
PROTEIN: NEGATIVE mg/dL
Specific Gravity, Urine: 1.041 — ABNORMAL HIGH (ref 1.005–1.030)
pH: 6 (ref 5.0–8.0)

## 2016-07-19 LAB — TROPONIN I
Troponin I: <0.03 ng/mL (ref <0.03)
Troponin I: <0.03 ng/mL (ref <0.03)
Troponin I: <0.03 ng/mL (ref <0.03)

## 2016-07-19 LAB — CBC
HCT: 42.2 % (ref 39.0–52.0)
Hemoglobin: 14.7 g/dL (ref 13.0–17.0)
MCH: 30.4 pg (ref 26.0–34.0)
MCHC: 34.8 g/dL (ref 30.0–36.0)
MCV: 87.2 fL (ref 78.0–100.0)
PLATELETS: 156 10*3/uL (ref 150–400)
RBC: 4.84 MIL/uL (ref 4.22–5.81)
RDW: 13 % (ref 11.5–15.5)
WBC: 6.4 10*3/uL (ref 4.0–10.5)

## 2016-07-19 LAB — MAGNESIUM: MAGNESIUM: 2 mg/dL (ref 1.7–2.4)

## 2016-07-19 LAB — PHOSPHORUS: PHOSPHORUS: 3.6 mg/dL (ref 2.5–4.6)

## 2016-07-19 LAB — TSH: TSH: 2.48 u[IU]/mL (ref 0.350–4.500)

## 2016-07-19 LAB — HEPARIN LEVEL (UNFRACTIONATED)
HEPARIN UNFRACTIONATED: 0.38 [IU]/mL (ref 0.30–0.70)
Heparin Unfractionated: 0.43 IU/mL (ref 0.30–0.70)

## 2016-07-19 MED ORDER — ACETAMINOPHEN 325 MG PO TABS: 650.0000 mg | ORAL_TABLET | Freq: Four times a day (QID) | ORAL | Status: DC | PRN

## 2016-07-19 MED ORDER — SODIUM CHLORIDE 0.9 % IV SOLN
INTRAVENOUS | Status: AC
  Administered 2016-07-19: 01:00:00 via INTRAVENOUS

## 2016-07-19 MED ORDER — ONDANSETRON HCL 4 MG PO TABS: 4.0000 mg | ORAL_TABLET | Freq: Four times a day (QID) | ORAL | Status: DC | PRN

## 2016-07-19 MED ORDER — ONDANSETRON HCL 4 MG/2ML IJ SOLN: 4.0000 mg | Freq: Four times a day (QID) | INTRAMUSCULAR | Status: DC | PRN

## 2016-07-19 MED ORDER — SODIUM CHLORIDE 0.9% FLUSH
3.0000 mL | Freq: Two times a day (BID) | INTRAVENOUS | Status: DC
  Administered 2016-07-19 – 2016-07-20 (×3): 3 mL via INTRAVENOUS

## 2016-07-19 MED ORDER — ACETAMINOPHEN 650 MG RE SUPP: 650.0000 mg | Freq: Four times a day (QID) | RECTAL | Status: DC | PRN

## 2016-07-19 MED ORDER — HYDROCODONE-ACETAMINOPHEN 5-325 MG PO TABS: 1.0000 | ORAL_TABLET | ORAL | Status: DC | PRN

## 2016-07-19 NOTE — Progress Notes (Signed)
ANTICOAGULATION CONSULT NOTE  - Follow-up  Pharmacy Consult for Heparin Indication: pulmonary embolus  No Known Allergies  Patient Measurements: Height: 5\' 10"  (177.8 cm) Weight: 268 lb 9.6 oz (121.8 kg) IBW/kg (Calculated) : 73 HEPARIN DW (KG): 100.4  Vital Signs: Temp: 98.9 F (37.2 C) (10/07 0655) Temp Source: Oral (10/07 0655) BP: 153/102 (10/07 0655) Pulse Rate: 82 (10/07 0655)  Labs:  Recent Labs  07/18/16 1627 07/18/16 1840 07/19/16 0051 07/19/16 0527  HGB 15.1  --   --  14.7  HCT 44.8  --   --  42.2  PLT 174  --   --  156  LABPROT  --  12.1  --   --   INR  --  0.89  --   --   HEPARINUNFRC  --   --   --  0.43  CREATININE 1.11  --   --  1.25*  TROPONINI <0.03  --  <0.03 <0.03    Estimated Creatinine Clearance: 92.5 mL/min (by C-G formula based on SCr of 1.25 mg/dL (H)).   Medical History: Past Medical History:  Diagnosis Date  . Gout     Medications:  Infusions:   Assessment: 62 yoM presents with leg swelling and shortness of breath.  CTA reveals BL PE w/ evidence of R heart strain.  Pharmacy to dose heparin gtt.   Today, 07/19/2016:   Initial heparin level therapeutic on heparin 1700 units/hr  CBC: Hgb and pltc WNL  No evidence of bleeding  Goal of Therapy:  Heparin level 0.3-0.7 units/ml Monitor platelets by anticoagulation protocol: Yes   Plan:   Continue heparin at current rate  Recheck heparin level to confirm therapeutic  Daily heparin level and CBC  Await plan for long-term anticoagulation  Doreene Eland, PharmD, BCPS.   Pager: [phone] 07/19/2016 7:09 AM

## 2016-07-19 NOTE — Progress Notes (Signed)
  Echocardiogram 2D Echocardiogram has been performed.  Bobbye Charleston 07/19/2016, 3:20 PM

## 2016-07-19 NOTE — Progress Notes (Signed)
ANTICOAGULATION CONSULT NOTE  - Follow-up  Pharmacy Consult for Heparin Indication: pulmonary embolus  No Known Allergies  Patient Measurements: Height: 5\' 10"  (177.8 cm) Weight: 268 lb 9.6 oz (121.8 kg) IBW/kg (Calculated) : 73 HEPARIN DW (KG): 100.4  Vital Signs: Temp: 98.9 F (37.2 C) (10/07 0655) Temp Source: Oral (10/07 0655) BP: 153/102 (10/07 0655) Pulse Rate: 82 (10/07 0655)  Labs:  Recent Labs  07/18/16 1627 07/18/16 1840 07/19/16 0051 07/19/16 0527 07/19/16 1143  HGB 15.1  --   --  14.7  --   HCT 44.8  --   --  42.2  --   PLT 174  --   --  156  --   LABPROT  --  12.1  --   --   --   INR  --  0.89  --   --   --   HEPARINUNFRC  --   --   --  0.43 0.38  CREATININE 1.11  --   --  1.25*  --   TROPONINI <0.03  --  <0.03 <0.03 <0.03    Estimated Creatinine Clearance: 92.5 mL/min (by C-G formula based on SCr of 1.25 mg/dL (H)).   Medical History: Past Medical History:  Diagnosis Date  . Gout     Medications:  Infusions:   Assessment: 67 yoM presents with leg swelling and shortness of breath.  CTA reveals BL PE w/ evidence of R heart strain.  Pharmacy to dose heparin gtt.   Today, 07/19/2016:  Confirmatory heparin level therapeutic on heparin 1700 units/hr  CBC: Hgb and pltc WNL  No evidence of bleeding per RN  Goal of Therapy:  Heparin level 0.3-0.7 units/ml Monitor platelets by anticoagulation protocol: Yes   Plan:   Continue heparin at current rate  Daily heparin level and CBC  Await plan for long-term anticoagulation  Dolly Rias RPh 07/19/2016, 12:54 PM Pager [phone]

## 2016-07-19 NOTE — Progress Notes (Signed)
PROGRESS NOTE  Jonathon [name] [id number] DOB: 1966-09-03 DOA: 07/18/2016 PCP: No primary care provider on file.  HPI/Recap of past 24 hours:  Feeling better, denies chest pain, no sob, no cough, left lower extremity edema has resolved Significant other in room  Assessment/Plan: Active Problems:   Pulmonary embolism ([place])  50 y.o. male with medical history significant of gout and OSA. Being admitted for bilateral PE in the setting of travel sedentary position  Present on Admission: . Pulmonary embolism ([place]) - Likely provoked due to long trips, pe burden submassive with right heart strain on CTA, echo pending, venous doppler pending, continue heparin drip for now, I have discussed with patient about transition to NOAC  Will keep on heparin today, his cr slightly elevated for unclear reason, will hydration and repeat cr in am  will need cancer screening  Gout currently stable  Body mass index is 38.54 kg/m. osa, not able to tolerate cpap    DVT prophylaxis:  Heparin  Code Status:  FULL CODE  as per patient    Family Communication:   patient and significant other in room    Disposition Plan:     To home once workup is complete and patient is stable   Consultants:  none  Procedures:  none  Antibiotics:  none   Objective: BP (!) 153/102 (BP Location: Left Arm)   Pulse 82   Temp 98.9 F (37.2 C) (Oral)   Resp 17   Ht 5\' 10"  (1.778 m)   Wt 121.8 kg (268 lb 9.6 oz)   SpO2 94%   BMI 38.54 kg/m   Intake/Output Summary (Last 24 hours) at 07/19/16 1334 Last data filed at 07/19/16 1000  Gross per 24 hour  Intake          1362.65 ml  Output              400 ml  Net           962.65 ml   Filed Weights   07/18/16 1439 07/18/16 1447 07/19/16 0022  Weight: 108.4 kg (239 lb) 108.4 kg (239 lb) 121.8 kg (268 lb 9.6 oz)    Exam:   General:  NAD  Cardiovascular: RRR  Respiratory: CTABL  Abdomen: Soft/ND/NT, positive BS  Musculoskeletal:  No Edema  Neuro: aaox3  Data Reviewed: Basic Metabolic Panel:  Recent Labs Lab 07/18/16 1627 07/19/16 0527  NA 140 140  K 3.9 4.3  CL 106 106  CO2 25 28  GLUCOSE 90 106*  BUN 19 16  CREATININE 1.11 1.25*  CALCIUM 9.5 9.1  MG  --  2.0  PHOS  --  3.6   Liver Function Tests:  Recent Labs Lab 07/19/16 0527  AST 19  ALT 23  ALKPHOS 78  BILITOT 1.2  PROT 7.1  ALBUMIN 4.1   No results for input(s): LIPASE, AMYLASE in the last 168 hours. No results for input(s): AMMONIA in the last 168 hours. CBC:  Recent Labs Lab 07/18/16 1627 07/19/16 0527  WBC 7.6 6.4  HGB 15.1 14.7  HCT 44.8 42.2  MCV 90.0 87.2  PLT 174 156   Cardiac Enzymes:    Recent Labs Lab 07/18/16 1627 07/19/16 0051 07/19/16 0527 07/19/16 1143  TROPONINI <0.03 <0.03 <0.03 <0.03   BNP (last 3 results)  Recent Labs  07/18/16 1627  BNP 10.0    ProBNP (last 3 results) No results for input(s): PROBNP in the last 8760 hours.  CBG: No results for input(s): GLUCAP in  the last 168 hours.  No results found for this or any previous visit (from the past 240 hour(s)).   Studies: Dg Chest 2 View  Result Date: 07/18/2016 CLINICAL DATA:  Initial evaluation for acute shortness of breath, dizziness on exertion. EXAM: CHEST  2 VIEW COMPARISON:  None available. FINDINGS: The heart size and mediastinal contours are within normal limits. Both lungs are clear. The visualized skeletal structures are unremarkable. IMPRESSION: No active cardiopulmonary disease. Electronically Signed   By: Jeannine Boga M.D.   On: 07/18/2016 18:51   Ct Angio Chest Pe W Or Wo Contrast  Result Date: 07/18/2016 CLINICAL DATA:  Shortness of breath and dizziness. EXAM: CT ANGIOGRAPHY CHEST WITH CONTRAST TECHNIQUE: Multidetector CT imaging of the chest was performed using the standard protocol during bolus administration of intravenous contrast. Multiplanar CT image reconstructions and MIPs were obtained to evaluate the  vascular anatomy. CONTRAST:  100 mL Isovue 370 IV COMPARISON:  Chest radiograph 07/18/2016 FINDINGS: Cardiovascular: There is satisfactory opacification of the pulmonary arteries to the segmental level. There are bilateral pulmonary emboli within the right and left pulmonary arteries, extending into the upper and lower lobar and segmental arteries on the right and into the upper lobar and posterior upper lobe segmental, lingular and lower lobar and segmental arteries on the right. The main pulmonary artery is within normal limits for size, measuring 2.7 cm at the bifurcation. The RV/LV ratio is calculated to be 0.9, suggesting right heart strain. The visualized aorta is normal. There is a normal 3-vessel arch branching pattern. Heart size is normal, without pericardial effusion. Mediastinum/Nodes: No mediastinal, hilar or axillary lymphadenopathy. The visualized thyroid and thoracic esophageal course are unremarkable. Lungs/Pleura: Lungs are clear. No pleural effusion or pneumothorax. Upper Abdomen: Contrast bolus timing is not optimized for evaluation of the abdominal organs. Within this limitation, the visualized organs of the upper abdomen are normal. Musculoskeletal: No chest wall abnormality. No acute or significant osseous findings. Review of the MIP images confirms the above findings. IMPRESSION: Acute pulmonary embolism within the bilateral pulmonary artery's, extending into the lobar and segmental branches with CT evidence of right heart strain (RV/LV Ratio = 0.9) consistent with at least submassive (intermediate risk) PE. The presence of right heart strain has been associated with an increased risk of morbidity and mortality. Please activate Code PE by paging [phone]. Critical Value/emergent results were called by telephone at the time of interpretation on 07/18/2016 at 10:15 pm to Dr. Addison Lank , who verbally acknowledged these results. Electronically Signed   By: Ulyses Jarred M.D.   On:  07/18/2016 22:18    Scheduled Meds: . sodium chloride flush  3 mL Intravenous Q12H    Continuous Infusions: . heparin 1,700 Units/hr (07/19/16 1058)     Time spent: 12mins  Mayson Sterbenz MD, PhD  [facility] Pager [phone]. If 7PM-7AM, please contact night-coverage at [email or web address], password [facility] 07/19/2016, 1:34 PM  LOS: 1 day

## 2016-07-20 ENCOUNTER — Inpatient Hospital Stay (HOSPITAL_COMMUNITY): Payer: Commercial Managed Care - HMO

## 2016-07-20 DIAGNOSIS — I82412 Acute embolism and thrombosis of left femoral vein: Secondary | ICD-10-CM

## 2016-07-20 DIAGNOSIS — I2699 Other pulmonary embolism without acute cor pulmonale: Secondary | ICD-10-CM

## 2016-07-20 DIAGNOSIS — I10 Essential (primary) hypertension: Secondary | ICD-10-CM

## 2016-07-20 LAB — BASIC METABOLIC PANEL
ANION GAP: 8 (ref 5–15)
BUN: 14 mg/dL (ref 6–20)
CALCIUM: 8.9 mg/dL (ref 8.9–10.3)
CO2: 23 mmol/L (ref 22–32)
Chloride: 108 mmol/L (ref 101–111)
Creatinine, Ser: 1.13 mg/dL (ref 0.61–1.24)
GLUCOSE: 100 mg/dL — AB (ref 65–99)
POTASSIUM: 4.2 mmol/L (ref 3.5–5.1)
Sodium: 139 mmol/L (ref 135–145)

## 2016-07-20 LAB — HEMOGLOBIN A1C
Hgb A1c MFr Bld: 5.4 % (ref 4.8–5.6)
Mean Plasma Glucose: 108 mg/dL

## 2016-07-20 LAB — CBC
HCT: 44.7 % (ref 39.0–52.0)
HEMOGLOBIN: 15.3 g/dL (ref 13.0–17.0)
MCH: 31 pg (ref 26.0–34.0)
MCHC: 34.2 g/dL (ref 30.0–36.0)
MCV: 90.5 fL (ref 78.0–100.0)
PLATELETS: 164 10*3/uL (ref 150–400)
RBC: 4.94 MIL/uL (ref 4.22–5.81)
RDW: 13.2 % (ref 11.5–15.5)
WBC: 6.8 10*3/uL (ref 4.0–10.5)

## 2016-07-20 LAB — HEPARIN LEVEL (UNFRACTIONATED): Heparin Unfractionated: 0.43 IU/mL (ref 0.30–0.70)

## 2016-07-20 LAB — PSA: PSA: 3.07 ng/mL (ref 0.00–4.00)

## 2016-07-20 MED ORDER — RIVAROXABAN (XARELTO) EDUCATION KIT FOR DVT/PE PATIENTS
PACK | Freq: Once | Status: DC
  Filled 2016-07-20: qty 1

## 2016-07-20 MED ORDER — RIVAROXABAN (XARELTO) VTE STARTER PACK (15 & 20 MG): ORAL_TABLET | ORAL | 0 refills | Status: DC

## 2016-07-20 MED ORDER — RIVAROXABAN 15 MG PO TABS
15.0000 mg | ORAL_TABLET | Freq: Two times a day (BID) | ORAL | Status: DC
  Administered 2016-07-20: 15 mg via ORAL
  Filled 2016-07-20: qty 1

## 2016-07-20 NOTE — Progress Notes (Signed)
*  Preliminary Results* Bilateral lower extremity venous duplex completed. The right lower extremity is negative for deep vein thrombosis.   The left lower extremity is positive for deep vein thrombosis involving the left distal femoral vein and the left popliteal vein.  There is no evidence of Baker's cyst bilaterally.  Preliminary results discussed with Elzie Rings, RN.  07/20/2016 9:51 AM Maudry Mayhew, BS, RVT, RDCS, RDMS

## 2016-07-20 NOTE — Progress Notes (Signed)
ANTICOAGULATION CONSULT NOTE - Initial Consult  Pharmacy Consult for xarelto Indication: pulmonary embolus  No Known Allergies  Patient Measurements: Height: 5\' 10"  (177.8 cm) Weight: 268 lb 9.6 oz (121.8 kg) IBW/kg (Calculated) : 73   Vital Signs: Temp: 98 F (36.7 C) (10/08 0540) Temp Source: Oral (10/08 0540) BP: 146/90 (10/08 0540) Pulse Rate: 78 (10/08 0540)  Labs:  Recent Labs  07/18/16 1627 07/18/16 1840 07/19/16 0051 07/19/16 0527 07/19/16 1143 07/20/16 0526  HGB 15.1  --   --  14.7  --  15.3  HCT 44.8  --   --  42.2  --  44.7  PLT 174  --   --  156  --  164  LABPROT  --  12.1  --   --   --   --   INR  --  0.89  --   --   --   --   HEPARINUNFRC  --   --   --  0.43 0.38 0.43  CREATININE 1.11  --   --  1.25*  --  1.13  TROPONINI <0.03  --  <0.03 <0.03 <0.03  --     Estimated Creatinine Clearance: 102.3 mL/min (by C-G formula based on SCr of 1.13 mg/dL).   Medical History: Past Medical History:  Diagnosis Date  . Gout      Assessment: 61 yoM presents with leg swelling and shortness of breath.  CTA reveals BL PE w/ evidence of R heart strain.  Pharmacy was dosing heparin gtt, now transitioning to xarelto  Goal of Therapy:   xarelto per indication and renal function  Plan:  - stop heparin drip and at the same time begin xarelto 15 mg twice daily for 21 days then xarelto 20mg  once daily thereafter - monitor renal function and signs and symptoms of bleeding - pharmacy will provide xarelto education  Dolly Rias RPh 07/20/2016, 9:14 AM Pager [phone]

## 2016-07-20 NOTE — Progress Notes (Signed)
D/C instructions reviewed w/ pt and SO.  Both verbalize understanding and all questions answered. Pt aware of need to p/u script at pharmacy on way home. Pt d/c in stable condition in w/c by NT to wife's car. Pt in possession of d/c instructions, script, and all personal belongings.

## 2016-07-20 NOTE — Discharge Summary (Signed)
Discharge Summary  Jonathon [name] [id number] DOB: 14-Dec-1965  PCP: No primary care provider on file.  Admit date: 07/18/2016 Discharge date: 07/20/2016  Time spent: <40mins  Recommendations for Outpatient Follow-up:  1. F/u with PMD within a week  for hospital discharge follow up, repeat cbc/bmp at follow up  Discharge Diagnoses:  Active Hospital Problems   Diagnosis Date Noted  . Pulmonary embolism ([place]) 07/18/2016    Resolved Hospital Problems   Diagnosis Date Noted Date Resolved  No resolved problems to display.    Discharge Condition: stable  Diet recommendation: heart healthy diet  Filed Weights   07/18/16 1439 07/18/16 1447 07/19/16 0022  Weight: 108.4 kg (239 lb) 108.4 kg (239 lb) 121.8 kg (268 lb 9.6 oz)    History of present illness:  Chief Complaint: leg swelling and shortness of breath  HPI: Jonathon [name] is a 50 y.o. male with medical history significant of gout and OSA.     Presented with patient has been having 2-3 weeks of shortness of breath and lightheadedness especially worse with exertion he was having trouble walking up the flight of stairs on his way to work. Patient discussed his symptoms with his coworker who had PE in the past and was recommended to present to emergency department which he did. Patient has had frequent travel he has traveled to [place] and has had some leg edema in May since he was seen by her primary care provider thought that this was likely gout since he has a history of gout. He was treated with prednisone with no relief. He denies any chest pain no syncope. He has had monthly driving trips and frequent flights.   Deneis any wheezing no back pain some chest tightness. He snores a lot He has done sleep study in the past and was diagnosed with sleep apnea but does not tolerate machine No blood in stools, no black stools no weight loss, no family hx of clotting disorder but father was on coumadin for unclear reason.   Patient never had colonoscopy  IN ER:  Temp (24hrs), Avg:98.3 F (36.8 C), Min:98.3 F (36.8 C), Max:98.3 F (36.8 C)   95% HR 72 SBP 150/104  Na 140 K 3.9 Cr 1.11  WBC  7.6   Hg15.1  D.dimer 5.29  Trop <0.03  CT bilateral PE He is started on heparin drip and admitted to hospitalist service  Hospital Course:  Active Problems:   Pulmonary embolism ([place])   50 y.o. malewith medical history significant of gout and OSA. Being admitted for bilateral PE in the setting of travel sedentary position  Present on Admission: .Pulmonary embolism ([place]) - Likely provoked due to long trips, pe burden submassive with right heart strain on CTA, echo LVEF wnl, + grade 1 diastolic dysfunction, right heart parameter wnl , venous doppler + DVT left lower extremity He is treated with heparin drip since admission, this is transitioned to xarelto on 10/8 after cr normalized.   will need cancer screening  Cr elevation : cr 1.25 on 10/7, , ua concentrated, transient cr elevation likely from dehydration, he also had CTA chest on admission cr  Normalized at discharge  HTN: bp elevated in the hospital, Echo with "moderate concentric hypertrophy", possible chronic HTN, advised patient to check blood pressure at home, loose weight and get sleep study May need to start on bp meds, to be determined by pmd   Gout currently stable  Body mass index is 38.54 kg/m. osa, not able to  tolerate cpap in the past (12years ago), advised patient to have repeat sleep study done.    DVT prophylaxis:on full dose Heparin  Code Status:FULL CODE as per patient   Family Communication:patient and significant other in room   Disposition Plan:To home on 10/8  Consultants:  none  Procedures:  none  Antibiotics:  none   Discharge Exam: BP (!) 146/90 (BP Location: Left Arm)   Pulse 78   Temp 98 F (36.7 C) (Oral)   Resp 18   Ht 5\' 10"  (1.778 m)   Wt 121.8 kg (268 lb  9.6 oz)   SpO2 94%   BMI 38.54 kg/m     General:  NAD. Obese   Cardiovascular: RRR  Respiratory: CTABL  Abdomen: Soft/ND/NT, positive BS  Musculoskeletal: No Edema  Neuro: aaox3    Discharge Instructions You were cared for by a hospitalist during your hospital stay. If you have any questions about your discharge medications or the care you received while you were in the hospital after you are discharged, you can call the unit and asked to speak with the hospitalist on call if the hospitalist that took care of you is not available. Once you are discharged, your primary care physician will handle any further medical issues. Please note that NO REFILLS for any discharge medications will be authorized once you are discharged, as it is imperative that you return to your primary care physician (or establish a relationship with a primary care physician if you do not have one) for your aftercare needs so that they can reassess your need for medications and monitor your lab values.  Discharge Instructions    Diet - low sodium heart healthy    Complete by:  As directed    Low fat, heart healthy   Increase activity slowly    Complete by:  As directed        Medication List    TAKE these medications   allopurinol 300 MG tablet Commonly known as:  ZYLOPRIM Take 300 mg by mouth daily.   naproxen sodium 220 MG tablet Commonly known as:  ANAPROX Take 440 mg by mouth 2 (two) times daily as needed (pain).   Rivaroxaban 15 & 20 MG Tbpk Take as directed on package: Start with one 15mg  tablet by mouth twice a day with food. On Day 22, switch to one 20mg  tablet once a day with food.      No Known Allergies Follow-up Information    please follow up with pmd for hospital discharge follow up,repeat cbc/bmp at followup,pmd to refer to sleep study and monitor blood pressure Follow up in 2 week(s).            The results of significant diagnostics from this hospitalization (including  imaging, microbiology, ancillary and laboratory) are listed below for reference.    Significant Diagnostic Studies: Dg Chest 2 View  Result Date: 07/18/2016 CLINICAL DATA:  Initial evaluation for acute shortness of breath, dizziness on exertion. EXAM: CHEST  2 VIEW COMPARISON:  None available. FINDINGS: The heart size and mediastinal contours are within normal limits. Both lungs are clear. The visualized skeletal structures are unremarkable. IMPRESSION: No active cardiopulmonary disease. Electronically Signed   By: Jeannine Boga M.D.   On: 07/18/2016 18:51   Ct Angio Chest Pe W Or Wo Contrast  Result Date: 07/18/2016 CLINICAL DATA:  Shortness of breath and dizziness. EXAM: CT ANGIOGRAPHY CHEST WITH CONTRAST TECHNIQUE: Multidetector CT imaging of the chest was performed using the standard protocol  during bolus administration of intravenous contrast. Multiplanar CT image reconstructions and MIPs were obtained to evaluate the vascular anatomy. CONTRAST:  100 mL Isovue 370 IV COMPARISON:  Chest radiograph 07/18/2016 FINDINGS: Cardiovascular: There is satisfactory opacification of the pulmonary arteries to the segmental level. There are bilateral pulmonary emboli within the right and left pulmonary arteries, extending into the upper and lower lobar and segmental arteries on the right and into the upper lobar and posterior upper lobe segmental, lingular and lower lobar and segmental arteries on the right. The main pulmonary artery is within normal limits for size, measuring 2.7 cm at the bifurcation. The RV/LV ratio is calculated to be 0.9, suggesting right heart strain. The visualized aorta is normal. There is a normal 3-vessel arch branching pattern. Heart size is normal, without pericardial effusion. Mediastinum/Nodes: No mediastinal, hilar or axillary lymphadenopathy. The visualized thyroid and thoracic esophageal course are unremarkable. Lungs/Pleura: Lungs are clear. No pleural effusion or  pneumothorax. Upper Abdomen: Contrast bolus timing is not optimized for evaluation of the abdominal organs. Within this limitation, the visualized organs of the upper abdomen are normal. Musculoskeletal: No chest wall abnormality. No acute or significant osseous findings. Review of the MIP images confirms the above findings. IMPRESSION: Acute pulmonary embolism within the bilateral pulmonary artery's, extending into the lobar and segmental branches with CT evidence of right heart strain (RV/LV Ratio = 0.9) consistent with at least submassive (intermediate risk) PE. The presence of right heart strain has been associated with an increased risk of morbidity and mortality. Please activate Code PE by paging [phone]. Critical Value/emergent results were called by telephone at the time of interpretation on 07/18/2016 at 10:15 pm to Dr. Addison Lank , who verbally acknowledged these results. Electronically Signed   By: Ulyses Jarred M.D.   On: 07/18/2016 22:18    Microbiology: No results found for this or any previous visit (from the past 240 hour(s)).   Labs: Basic Metabolic Panel:  Recent Labs Lab 07/18/16 1627 07/19/16 0527 07/20/16 0526  NA 140 140 139  K 3.9 4.3 4.2  CL 106 106 108  CO2 25 28 23   GLUCOSE 90 106* 100*  BUN 19 16 14   CREATININE 1.11 1.25* 1.13  CALCIUM 9.5 9.1 8.9  MG  --  2.0  --   PHOS  --  3.6  --    Liver Function Tests:  Recent Labs Lab 07/19/16 0527  AST 19  ALT 23  ALKPHOS 78  BILITOT 1.2  PROT 7.1  ALBUMIN 4.1   No results for input(s): LIPASE, AMYLASE in the last 168 hours. No results for input(s): AMMONIA in the last 168 hours. CBC:  Recent Labs Lab 07/18/16 1627 07/19/16 0527 07/20/16 0526  WBC 7.6 6.4 6.8  HGB 15.1 14.7 15.3  HCT 44.8 42.2 44.7  MCV 90.0 87.2 90.5  PLT 174 156 164   Cardiac Enzymes:  Recent Labs Lab 07/18/16 1627 07/19/16 0051 07/19/16 0527 07/19/16 1143  TROPONINI <0.03 <0.03 <0.03 <0.03   BNP: BNP (last 3  results)  Recent Labs  07/18/16 1627  BNP 10.0    ProBNP (last 3 results) No results for input(s): PROBNP in the last 8760 hours.  CBG: No results for input(s): GLUCAP in the last 168 hours.     SignedFlorencia Reasons MD, PhD  [facility] 07/20/2016, 12:17 PM

## 2016-07-20 NOTE — Progress Notes (Signed)
Cm spoke to patient who requested his medication coupon for Xarelto be sent to [facility] on [address]. CM faxed Xarelto 30 day free card to [facility] @ [phone] and confirmation received.

## 2016-07-20 NOTE — Discharge Instructions (Addendum)
Pulmonary Embolism A pulmonary embolism (PE) is a sudden blockage or decrease of blood flow in one lung or both lungs. Most blockages come from a blood clot that travels from the legs or the pelvis to the lungs. PE is a dangerous and potentially life-threatening condition if it is not treated right away. CAUSES A pulmonary embolism occurs most commonly when a blood clot travels from one of your veins to your lungs. Rarely, PE is caused by air, fat, amniotic fluid, or part of a tumor traveling through your veins to your lungs. RISK FACTORS A PE is more likely to develop in:  People who smoke.  People who areolder, especially over 33 years of age.  People who are overweight (obese).  People who sit or lie still for a long time, such as during long-distance travel (over 4 hours), bed rest, hospitalization, or during recovery from certain medical conditions like a stroke.  People who do not engage in much physical activity (sedentary lifestyle).  People who have chronic breathing disorders.  People whohave a personal or family history of blood clots or blood clotting disease.  People whohave peripheral vascular disease (PVD), diabetes, or some types of cancer.  People who haveheart disease, especially if the person had a recent heart attack or has congestive heart failure.  People who have neurological diseases that affect the legs (leg paresis).  People who have had a traumatic injury, such as breaking a hip or leg.  People whohave recently had major or lengthy surgery, especially on the hip, knee, or abdomen.  People who have hada central line placed inside a large vein.  People who takemedicines that contain the hormone estrogen. These include birth control pills and hormone replacement therapy.  Pregnancy or during childbirth or the postpartum period. SIGNS AND SYMPTOMS  The symptoms of a PE usually start suddenly and include:  Shortness of breath while active or at  rest.  Coughing or coughing up blood or blood-tinged mucus.  Chest pain that is often worse with deep breaths.  Rapid or irregular heartbeat.  Feeling light-headed or dizzy.  Fainting.  Feelinganxious.  Sweating. There may also be pain and swelling in a leg if that is where the blood clot started. These symptoms may represent a serious problem that is an emergency. Do not wait to see if the symptoms will go away. Get medical help right away. Call your local emergency services (911 in the [place] Do not drive yourself to the hospital. DIAGNOSIS Your health care provider will take a medical history and perform a physical exam. You may also have other tests, including:  Blood tests to assess the clotting properties of your blood, assess oxygen levels in your blood, and find blood clots.  Imaging tests, such as CT, ultrasound, MRI, X-ray, and other tests to see if you have clots anywhere in your body.  An electrocardiogram (ECG) to look for heart strain from blood clots in the lungs. TREATMENT The main goals of PE treatment are:  To stop a blood clot from growing larger.  To stop new blood clots from forming. The type of treatment that you receive depends on many factors, such as the cause of your PE, your risk for bleeding or developing more clots, and other medical conditions that you have. Sometimes, a combination of treatments is necessary. This condition may be treated with:  Medicines, including newer oral blood thinners (anticoagulants), warfarin, low molecular weight heparins, thrombolytics, or heparins.  Wearing compression stockings or using different  types of devices.  Surgery (rare) to remove the blood clot or to place a filter in your abdomen to stop the blood clot from traveling to your lungs. Treatments for a PE are often divided into immediate treatment, long-term treatment (up to 3 months after PE), and extended treatment (more than 3 months after PE). Your  treatment may continue for several months. This is called maintenance therapy, and it is used to prevent the forming of new blood clots. You can work with your health care provider to choose the treatment program that is best for you. What are anticoagulants? Anticoagulants are medicines that treat PEs. They can stop current blood clots from growing and stop new clots from forming. They cannot dissolve existing clots. Your body dissolves clots by itself over time. Anticoagulants are given by mouth, by injection, or through an IV tube. What are thrombolytics? Thrombolytics are clot-dissolving medicines that are used to dissolve a PE. They carry a high risk of bleeding, so they tend to be used only in severe cases or if you have very low blood pressure. HOME CARE INSTRUCTIONS If you are taking a newer oral anticoagulant:  Take the medicine every single day at the same time each day.  Understand what foods and drugs interact with this medicine.  Understand that there are no regular blood tests required when using this medicine.  Understandthe side effects of this medicine, including excessive bruising or bleeding. Ask your health care provider or pharmacist about other possible side effects. If you are taking warfarin:  Understand how to take warfarin and know which foods can affect how warfarin works in Veterinary surgeon.  Understand that it is dangerous to taketoo much or too little warfarin. Too much warfarin increases the risk of bleeding. Too little warfarin continues to allow the risk for blood clots.  Follow your PT and INR blood testing schedule. The PT and INR results allow your health care provider to adjust your dose of warfarin. It is very important that you have your PT and INR tested as often as told by your health care provider.  Avoid major changes in your diet, or tell your health care provider before you change your diet. Arrange a visit with a registered dietitian to answer your  questions. Many foods, especially foods that are high in vitamin K, can interfere with warfarin and affect the PT and INR results. Eat a consistent amount of foods that are high in vitamin K, such as:  Spinach, kale, broccoli, cabbage, collard greens, turnip greens, Brussels sprouts, peas, cauliflower, seaweed, and parsley.  Beef liver and pork liver.  Green tea.  Soybean oil.  Tell your health care provider about any and all medicines, vitamins, and supplements that you take, including aspirin and other over-the-counter anti-inflammatory medicines. Be especially cautious with aspirin and anti-inflammatory medicines. Do not take those before you ask your health care provider if it is safe to do so. This is important because many medicines can interfere with warfarin and affect the PT and INR results.  Do not start or stop taking any over-the-counter or prescription medicine unless your health care provider or pharmacist tells you to do so. If you take warfarin, you will also need to do these things:  Hold pressure over cuts for longer than usual.  Tell your dentist and other health care providers that you are taking warfarin before you have any procedures in which bleeding may occur.  Avoid alcohol or drink very small amounts. Tell your health care  provider if you change your alcohol intake.  Do not use tobacco products, including cigarettes, chewing tobacco, and e-cigarettes. If you need help quitting, ask your health care provider.  Avoid contact sports. General Instructions  Take over-the-counter and prescription medicines only as told by your health care provider. Anticoagulant medicines can have side effects, including easy bruising and difficulty stopping bleeding. If you are prescribed an anticoagulant, you will also need to do these things:  Hold pressure over cuts for longer than usual.  Tell your dentist and other health care providers that you are taking anticoagulants  before you have any procedures in which bleeding may occur.  Avoid contact sports.  Wear a medical alert bracelet or carry a medical alert card that says you have had a PE.  Ask your health care provider how soon you can go back to your normal activities. Stay active to prevent new blood clots from forming.  Make sure to exercise while traveling or when you have been sitting or standing for a long period of time. It is very important to exercise. Exercise your legs by walking or by tightening and relaxing your leg muscles often. Take frequent walks.  Wear compression stockings as told by your health care provider to help prevent more blood clots from forming.  Do not use tobacco products, including cigarettes, chewing tobacco, and e-cigarettes. If you need help quitting, ask your health care provider.  Keep all follow-up appointments with your health care provider. This is important. PREVENTION Take these actions to decrease your risk of developing another PE:  Exercise regularly. For at least 30 minutes every day, engage in:  Activity that involves moving your arms and legs.  Activity that encourages good blood flow through your body by increasing your heart rate.  Exercise your arms and legs every hour during long-distance travel (over 4 hours). Drink plenty of water and avoid drinking alcohol while traveling.  Avoid sitting or lying in bed for long periods of time without moving your legs.  Maintain a weight that is appropriate for your height. Ask your health care provider what weight is healthy for you.  If you are a woman who is over 55 years of age, avoid unnecessary use of medicines that contain estrogen. These include birth control pills.  Do not smoke, especially if you take estrogen medicines. If you need help quitting, ask your health care provider.  If you are at very high risk for PE, wear compression stockings.  If you recently had a PE, have regularly scheduled  ultrasound testing on your legs to check for new blood clots. If you are hospitalized, prevention measures may include:  Early walking after surgery, as soon as your health care provider says that it is safe.  Receiving anticoagulants to prevent blood clots. If you cannot take anticoagulants, other options may be available, such as wearing compression stockings or using different types of devices. SEEK IMMEDIATE MEDICAL CARE IF:  You have new or increased pain, swelling, or redness in an arm or leg.  You have numbness or tingling in an arm or leg.  You have shortness of breath while active or at rest.  You have chest pain.  You have a rapid or irregular heartbeat.  You feel light-headed or dizzy.  You cough up blood.  You notice blood in your vomit, bowel movement, or urine.  You have a fever. These symptoms may represent a serious problem that is an emergency. Do not wait to see if the symptoms  will go away. Get medical help right away. Call your local emergency services (911 in the [place] Do not drive yourself to the hospital.   This information is not intended to replace advice given to you by your health care provider. Make sure you discuss any questions you have with your health care provider.   Document Released: 09/26/2000 Document Revised: 06/20/2015 Document Reviewed: 01/24/2015 Elsevier Interactive Patient Education 2016 [place] on my medicine - XARELTO (rivaroxaban)  This medication education was reviewed with me or my healthcare representative as part of my discharge preparation.  The pharmacist that spoke with me during my hospital stay was:  Angela Adam, [place]? Xarelto was prescribed to treat blood clots that may have been found in the veins of your legs (deep vein thrombosis) or in your lungs (pulmonary embolism) and to reduce the risk of them occurring again.  What do you need to know about  Xarelto? The starting dose is one 15 mg tablet taken TWICE daily with food for the FIRST 21 DAYS then on (enter date)  Oct 29  the dose is changed to one 20 mg tablet taken ONCE A DAY with your evening meal.  DO NOT stop taking Xarelto without talking to the health care provider who prescribed the medication.  Refill your prescription for 20 mg tablets before you run out.  After discharge, you should have regular check-up appointments with your healthcare provider that is prescribing your Xarelto.  In the future your dose may need to be changed if your kidney function changes by a significant amount.  What do you do if you miss a dose? If you are taking Xarelto TWICE DAILY and you miss a dose, take it as soon as you remember. You may take two 15 mg tablets (total 30 mg) at the same time then resume your regularly scheduled 15 mg twice daily the next day.  If you are taking Xarelto ONCE DAILY and you miss a dose, take it as soon as you remember on the same day then continue your regularly scheduled once daily regimen the next day. Do not take two doses of Xarelto at the same time.   Important Safety Information Xarelto is a blood thinner medicine that can cause bleeding. You should call your healthcare provider right away if you experience any of the following: ? Bleeding from an injury or your nose that does not stop. ? Unusual colored urine (red or dark brown) or unusual colored stools (red or black). ? Unusual bruising for unknown reasons. ? A serious fall or if you hit your head (even if there is no bleeding).  Some medicines may interact with Xarelto and might increase your risk of bleeding while on Xarelto. To help avoid this, consult your healthcare provider or pharmacist prior to using any new prescription or non-prescription medications, including herbals, vitamins, non-steroidal anti-inflammatory drugs (NSAIDs) and supplements.  This website has more information on Xarelto:  [email or web address]

## 2016-07-30 ENCOUNTER — Encounter: Payer: Self-pay | Admitting: Family Medicine

## 2016-07-30 ENCOUNTER — Ambulatory Visit (INDEPENDENT_AMBULATORY_CARE_PROVIDER_SITE_OTHER): Payer: Commercial Managed Care - HMO | Admitting: Family Medicine

## 2016-07-30 VITALS — BP 132/90 | HR 72 | Temp 98.1°F | Ht 70.5 in | Wt 267.0 lb

## 2016-07-30 DIAGNOSIS — Z86718 Personal history of other venous thrombosis and embolism: Secondary | ICD-10-CM | POA: Insufficient documentation

## 2016-07-30 DIAGNOSIS — G4733 Obstructive sleep apnea (adult) (pediatric): Secondary | ICD-10-CM

## 2016-07-30 DIAGNOSIS — I824Y2 Acute embolism and thrombosis of unspecified deep veins of left proximal lower extremity: Secondary | ICD-10-CM

## 2016-07-30 DIAGNOSIS — M109 Gout, unspecified: Secondary | ICD-10-CM | POA: Insufficient documentation

## 2016-07-30 DIAGNOSIS — I10 Essential (primary) hypertension: Secondary | ICD-10-CM

## 2016-07-30 DIAGNOSIS — I82402 Acute embolism and thrombosis of unspecified deep veins of left lower extremity: Secondary | ICD-10-CM

## 2016-07-30 DIAGNOSIS — M1009 Idiopathic gout, multiple sites: Secondary | ICD-10-CM | POA: Diagnosis not present

## 2016-07-30 DIAGNOSIS — I82509 Chronic embolism and thrombosis of unspecified deep veins of unspecified lower extremity: Secondary | ICD-10-CM | POA: Insufficient documentation

## 2016-07-30 DIAGNOSIS — I2601 Septic pulmonary embolism with acute cor pulmonale: Secondary | ICD-10-CM | POA: Diagnosis not present

## 2016-07-30 HISTORY — DX: Acute embolism and thrombosis of unspecified deep veins of left lower extremity: I82.402

## 2016-07-30 LAB — COMPREHENSIVE METABOLIC PANEL
ALBUMIN: 4.4 g/dL (ref 3.5–5.2)
ALK PHOS: 97 U/L (ref 39–117)
ALT: 28 U/L (ref 0–53)
AST: 16 U/L (ref 0–37)
BUN: 15 mg/dL (ref 6–23)
CHLORIDE: 105 meq/L (ref 96–112)
CO2: 30 mEq/L (ref 19–32)
CREATININE: 1.03 mg/dL (ref 0.40–1.50)
Calcium: 9.6 mg/dL (ref 8.4–10.5)
GFR: 81.17 mL/min (ref >60.00)
GLUCOSE: 80 mg/dL (ref 70–99)
POTASSIUM: 4 meq/L (ref 3.5–5.1)
SODIUM: 141 meq/L (ref 135–145)
TOTAL PROTEIN: 7.4 g/dL (ref 6.0–8.3)
Total Bilirubin: 0.5 mg/dL (ref 0.2–1.2)

## 2016-07-30 LAB — CBC
HCT: 45 % (ref 39.0–52.0)
Hemoglobin: 15.4 g/dL (ref 13.0–17.0)
MCHC: 34.1 g/dL (ref 30.0–36.0)
MCV: 89.4 fl (ref 78.0–100.0)
Platelets: 237 10*3/uL (ref 150.0–400.0)
RBC: 5.03 Mil/uL (ref 4.22–5.81)
RDW: 12.7 % (ref 11.5–15.5)
WBC: 6.2 10*3/uL (ref 4.0–10.5)

## 2016-07-30 MED ORDER — RIVAROXABAN 20 MG PO TABS: 20.0000 mg | ORAL_TABLET | Freq: Every day | ORAL | 5 refills | Status: DC

## 2016-07-30 NOTE — Assessment & Plan Note (Addendum)
S: Patient was hospitalized after 2-3 weeks of worsening SOB with stairs (Was still doing elliptical with no issues) and found to have submassive PE but with evidence of right heart strain on CT. Origin was thought to be left DVT. Patient had flown to [place] in May and had swelling in his leg which was thought related to gout. Then flew to [place] before SOB episodes started. Does a lot of travel with his work. Interestingly before workup- had no trouble on ellipitical but only on stairs A/P: Provoked DVT due to travel likely. Xarelto 6 months minimum but as will still be traveling after that will have to decide on longer term treatment. With size of PE- would consider repeat scan even though radiation exposure is concern. DVT may be worth reimaging and no radiation as another option- obviously xarelto covers this. Discussed 3 weeks of 15mg  BID, followed by 20mg  daily. Restart exercise slowly.   Agree with notes from d/c summary of malignancy screening needed- wait to do this after 6 months of treatment.

## 2016-07-30 NOTE — Assessment & Plan Note (Signed)
S: Home BPs checked since D/c (at pharmacy) and <140/90 about 50% of the time. Mild elevation today BP Readings from Last 3 Encounters:  07/30/16 132/90  07/20/16 (!) 146/90  A/P: discussed dash eating plan and weight loss- if does not improve- may need medication. 3 month check in.

## 2016-07-30 NOTE — Assessment & Plan Note (Signed)
S: Diagnosed around 2006- cpap one night only. Did not like the study that was done originally and little interest in doing this again even with risks. No snoring wehen weight down to 20-210 A/P: we are going to focus on weight loss and then reassess. See HTN notes

## 2016-07-30 NOTE — Patient Instructions (Addendum)
Blood pressure hair high. Concern for sleep apnea. Both helped by weight loss.  Dash eating plan reasonable. Also like weight watchers. Slowly go back into exercise as long as no discomfort.   Follow up 3 months to check on weight and blood pressure. Keep track before visit on home blood pressure #s again- very helpful  Xarelto plan is at least 6 months- we will talk at that time about considering coming off  Labs  [place] stands for "Dietary Approaches to Stop Hypertension." The DASH eating plan is a healthy eating plan that has been shown to reduce high blood pressure (hypertension). Additional health benefits may include reducing the risk of type 2 diabetes mellitus, heart disease, and stroke. The DASH eating plan may also help with weight loss. WHAT DO I NEED TO KNOW ABOUT THE DASH EATING PLAN? For the DASH eating plan, you will follow these general guidelines:  Choose foods with a percent daily value for sodium of less than 5% (as listed on the food label).  Use salt-free seasonings or herbs instead of table salt or sea salt.  Check with your health care provider or pharmacist before using salt substitutes.  Eat lower-sodium products, often labeled as "lower sodium" or "no salt added."  Eat fresh foods.  Eat more vegetables, fruits, and low-fat dairy products.  Choose whole grains. Look for the word "whole" as the first word in the ingredient list.  Choose fish and skinless chicken or [place] more often than red meat. Limit fish, poultry, and meat to 6 oz (170 g) each day.  Limit sweets, desserts, sugars, and sugary drinks.  Choose heart-healthy fats.  Limit cheese to 1 oz (28 g) per day.  Eat more home-cooked food and less restaurant, buffet, and fast food.  Limit fried foods.  Cook foods using methods other than frying.  Limit canned vegetables. If you do use them, rinse them well to decrease the sodium.  When eating at a restaurant, ask that your food  be prepared with less salt, or no salt if possible. WHAT FOODS CAN I EAT? Seek help from a dietitian for individual calorie needs. Grains Whole grain or whole wheat bread. Brown rice. Whole grain or whole wheat pasta. Quinoa, bulgur, and whole grain cereals. Low-sodium cereals. Corn or whole wheat flour tortillas. Whole grain cornbread. Whole grain crackers. Low-sodium crackers. Vegetables Fresh or frozen vegetables (raw, steamed, roasted, or grilled). Low-sodium or reduced-sodium tomato and vegetable juices. Low-sodium or reduced-sodium tomato sauce and paste. Low-sodium or reduced-sodium canned vegetables.  Fruits All fresh, canned (in natural juice), or frozen fruits. Meat and Other Protein Products Ground beef (85% or leaner), grass-fed beef, or beef trimmed of fat. Skinless chicken or [place]. Ground chicken or [place]. Pork trimmed of fat. All fish and seafood. Eggs. Dried beans, peas, or lentils. Unsalted nuts and seeds. Unsalted canned beans. Dairy Low-fat dairy products, such as skim or 1% milk, 2% or reduced-fat cheeses, low-fat ricotta or cottage cheese, or plain low-fat yogurt. Low-sodium or reduced-sodium cheeses. Fats and Oils Tub margarines without trans fats. Light or reduced-fat mayonnaise and salad dressings (reduced sodium). Avocado. Safflower, olive, or canola oils. Natural peanut or almond butter. Other Unsalted popcorn and pretzels. The items listed above may not be a complete list of recommended foods or beverages. Contact your dietitian for more options. WHAT FOODS ARE NOT RECOMMENDED? Grains White bread. White pasta. White rice. Refined cornbread. Bagels and croissants. Crackers that contain trans fat. Vegetables Creamed or fried vegetables. Vegetables  in a cheese sauce. Regular canned vegetables. Regular canned tomato sauce and paste. Regular tomato and vegetable juices. Fruits Dried fruits. Canned fruit in light or heavy syrup. Fruit juice. Meat and Other Protein  Products Fatty cuts of meat. Ribs, chicken wings, bacon, sausage, bologna, salami, chitterlings, fatback, hot dogs, bratwurst, and packaged luncheon meats. Salted nuts and seeds. Canned beans with salt. Dairy Whole or 2% milk, cream, half-and-half, and cream cheese. Whole-fat or sweetened yogurt. Full-fat cheeses or blue cheese. Nondairy creamers and whipped toppings. Processed cheese, cheese spreads, or cheese curds. Condiments Onion and garlic salt, seasoned salt, table salt, and sea salt. Canned and packaged gravies. Worcestershire sauce. Tartar sauce. Barbecue sauce. Teriyaki sauce. Soy sauce, including reduced sodium. Steak sauce. Fish sauce. Oyster sauce. Cocktail sauce. Horseradish. Ketchup and mustard. Meat flavorings and tenderizers. Bouillon cubes. Hot sauce. Tabasco sauce. Marinades. Taco seasonings. Relishes. Fats and Oils Butter, stick margarine, lard, shortening, ghee, and bacon fat. Coconut, palm kernel, or palm oils. Regular salad dressings. Other Pickles and olives. Salted popcorn and pretzels. The items listed above may not be a complete list of foods and beverages to avoid. Contact your dietitian for more information. WHERE CAN I FIND MORE INFORMATION? National Heart, Lung, and Blood Institute: travelstabloid.com   This information is not intended to replace advice given to you by your health care provider. Make sure you discuss any questions you have with your health care provider.   Document Released: 09/18/2011 Document Revised: 10/20/2014 Document Reviewed: 08/03/2013 Elsevier Interactive Patient Education [employer].

## 2016-07-30 NOTE — Assessment & Plan Note (Signed)
S: Allopurinol 300mg . No attack since 2016 at least. Leg swelling thought due to gout previously by outside office- likely was DVT related A/P: continue current medicine

## 2016-07-30 NOTE — Progress Notes (Signed)
Phone: [phone]  Subjective:  Patient presents today to establish care as prior patient of [facility]. This is also hospital follow up for him. Chief complaint-noted.   See problem oriented charting  The following were reviewed and entered/updated in epic: Past Medical History:  Diagnosis Date  . Chicken pox   . Gout   . Left leg DVT ([place]) 07/30/2016  . Pulmonary embolism ([place]) 07/18/2016   Patient Active Problem List   Diagnosis Date Noted  . Left leg DVT ([place]) 07/30/2016    Priority: High  . Pulmonary embolism ([place]) 07/18/2016    Priority: High  . Gout 07/30/2016    Priority: Medium  . OSA (obstructive sleep apnea) 07/30/2016    Priority: Medium  . Essential hypertension 07/30/2016    Priority: Medium   Past Surgical History:  Procedure Laterality Date  . APPENDECTOMY  2003  . CHOLECYSTECTOMY  2002  . HERNIA REPAIR     as child- unsure location  . LASIK     similar to but not lasik per patient  . NASAL SEPTUM SURGERY  1996    Family History  Problem Relation Age of Onset  . Heart attack Father 82  . Hypercalcemia Father   . Diabetes Father   . Hypertension Father   . Uterine cancer Mother   . Stroke Paternal Uncle   . Alcohol abuse Paternal Grandfather     Medications- reviewed and updated Current Outpatient Prescriptions  Medication Sig Dispense Refill  . allopurinol (ZYLOPRIM) 300 MG tablet Take 300 mg by mouth daily.    . Rivaroxaban 15 & 20 MG TBPK Take as directed on package: Start with one 15mg  tablet by mouth twice a day with food. On Day 22, switch to one 20mg  tablet once a day with food. 51 each 0  . rivaroxaban (XARELTO) 20 MG TABS tablet Take 1 tablet (20 mg total) by mouth daily with supper. 30 tablet 5   No current facility-administered medications for this visit.     Allergies-reviewed and updated No Known Allergies  Social History   Social History  . Marital status: Married    Spouse name: N/A  . Number of children: N/A   . Years of education: N/A   Social History Main Topics  . Smoking status: Never Smoker  . Smokeless tobacco: Never Used  . Alcohol use 3.0 - 3.6 oz/week    5 - 6 Cans of beer per week     Comment: occasional heavy  . Drug use: No  . Sexual activity: Not Asked   Other Topics Concern  . None   Social History Narrative   Divorced. Daughter Elmyra Ricks (my patient). Lives with son matthew 51 in 2017. Also has family friend going through divorce living with him.       [employer].    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 Negative for chills and fever.  HENT: Negative for hearing loss.   Eyes: Negative for blurred vision and double vision.  Respiratory: Positive for shortness of breath (improving since xarelto). Negative for cough and hemoptysis.   Cardiovascular: Negative for chest pain and palpitations.  Gastrointestinal: Negative for heartburn and nausea.  Genitourinary: Negative for dysuria and urgency.  Musculoskeletal: Negative for myalgias and neck pain.  Skin: Negative for itching and rash.  Neurological: Positive for headaches (mild  at times after starting xarelto). Negative for dizziness and tingling.  Endo/Heme/Allergies: Negative for polydipsia. Does not bruise/bleed easily.  Psychiatric/Behavioral: Negative for hallucinations and substance abuse.   Objective: BP 132/90 (BP Location: Left Arm, Patient Position: Sitting, Cuff Size: Large)   Pulse 72   Temp 98.1 F (36.7 C) (Oral)   Ht 5' 10.5" (1.791 m)   Wt 267 lb (121.1 kg)   SpO2 98%   BMI 37.77 kg/m  Gen: NAD, resting comfortably HEENT: Mucous membranes are moist. Oropharynx normal. TM normal. Eyes: sclera and lids normal, PERRLA Neck: no thyromegaly, no cervical lymphadenopathy CV: RRR no murmurs rubs or gallops Lungs: CTAB no  crackles, wheeze, rhonchi Abdomen: soft/nontender/nondistended/normal bowel sounds. No rebound or guarding.  Ext: no edema on right, 2+ pitting on left with enlarged calf size compared to right Skin: warm, dry Neuro: 5/5 strength in upper and lower extremities, normal gait, normal reflexes  Assessment/Plan:  Pulmonary embolism (HCC) S: Patient was hospitalized after 2-3 weeks of worsening SOB with stairs (Was still doing elliptical with no issues) and found to have submassive PE but with evidence of right heart strain on CT. Origin was thought to be left DVT. Patient had flown to [place] in May and had swelling in his leg which was thought related to gout by prior PCP reportedly. Then flew to [place] before SOB episodes started. Does a lot of travel with his work. Interestingly before workup- had no trouble on ellipitical but only on stairs. Also had 2-d echo which showed moderate concentric hypertrophy - which could be mor eBP related A/P: Provoked DVT due to travel likely. Xarelto 6 months minimum but as will still be traveling after that will have to decide on longer term treatment. With size of PE- would consider repeat scan even though radiation exposure is concern. DVT may be worth reimaging and no radiation as another option- obviously xarelto covers this. Discussed 3 weeks of 15mg  BID, followed by 20mg  daily. Restart exercise slowly.   Agree with notes from d/c summary of malignancy screening needed- wait to do this after 6 months of treatment.    OSA (obstructive sleep apnea) S: Diagnosed around 2006- cpap one night only. Did not like the study that was done originally and little interest in doing this again even with risks. No snoring wehen weight down to 20-210 A/P: we are going to focus on weight loss and then reassess. See HTN notes  Essential hypertension S: Home BPs checked since D/c (at pharmacy) and <140/90 about 50% of the time. Mild elevation today BP Readings from Last 3  Encounters:  07/30/16 132/90  07/20/16 (!) 146/90  A/P: discussed dash eating plan and weight loss- if does not improve- may need medication. 3 month check in.    Gout S: Allopurinol 300mg . No attack since 2016 at least. Leg swelling thought due to gout previously by outside office- likely was DVT related A/P: continue current medicine   Return in about 3 months (around 10/30/2016).   Orders Placed This Encounter  Procedures  . CBC    [place]  . Comprehensive metabolic panel    [place]    Meds ordered this encounter  Medications  . rivaroxaban (XARELTO) 20 MG TABS tablet    Sig: Take 1 tablet (20 mg total) by mouth daily with supper.    Dispense:  30 tablet    Refill:  5    Return precautions advised.  Garret Reddish, MD

## 2016-07-30 NOTE — Progress Notes (Signed)
Pre visit review using our clinic review tool, if applicable. No additional management support is needed unless otherwise documented below in the visit note. 

## 2016-08-08 ENCOUNTER — Telehealth: Payer: Self-pay | Admitting: Family Medicine

## 2016-08-08 NOTE — Telephone Encounter (Signed)
Team health would like to ask Dr. Yong Channel a question.

## 2016-08-08 NOTE — Telephone Encounter (Signed)
[facility] - Client  [place]    --------------------------------------------------------------------------------   Patient Name: Jonathon [name]  Gender: Male  DOB: 1966-02-01   Age: 50 Y 3 M 1 D  Return Phone Number: [phone] (Primary)  Address:     City/State/Zip:  [place]     Client [facility] - Client  Client Site [facility] - Day  Physician Garret Reddish - MD  Contact Type Call  Who Is Calling Patient / Member / Family / Caregiver  Call Type Triage / Clinical  Relationship To Patient Self  Return Phone Number [phone] (Primary)  Chief Complaint Leg Swelling And Edema  Reason for Call Symptomatic / Request for [place] states he currently has a blood clot in his left leg - In the morning his leg is normal but as he goes through the day, his leg and foot get more swollen. His foot is swollen right now and is not painful. - He wants to know if this is normal.  Appointment Disposition EMR Appointment Not Necessary  Info pasted into Epic Yes  Translation No  No Triage Reason Other  No Triage Reason Other Pts symptoms not found in GL> will call oncall MD        Nurse Assessment  Nurse: Venetia Maxon, RN, Manuela Schwartz Date/Time Eilene Ghazi Time): 08/08/2016 4:14:19 PM  Confirm and document reason for call. If symptomatic, describe symptoms. You must click the next button to save text entered. ---Caller states he currently has a blood clot in his left leg - In the morning his leg is normal but as he goes through the day, his leg and foot get more swollen. His foot is swollen right now and is not painful. - He wants to know if this is normal. DXD DVT 07/18/16 he is on Xarelto . He has the same amt of edema in LLE as it was . he wonders if it should be getting better. He was using an incline bar for sit ups at the gym and noticed pain in  Left calf. He has a PE> dxd the same day 07/18/16 .    Has the patient traveled out of the country within the last 30 days? ---No    Does the patient have any new or worsening symptoms? ---Yes    Will a triage be completed? ---Yes    Related visit to physician within the last 2 weeks? ---Yes    Does the PT have any chronic conditions? (i.e. diabetes, asthma, etc.) ---Yes    List chronic conditions. ---PE and DVT since he has been on xarelto on no meds hx of gout : either foot and great toe.    Is this a behavioral health or substance abuse call? ---No           Guidelines          Guideline Title Affirmed Question Affirmed Notes Nurse Date/Time (Eastern Time)       Disp. Time Eilene Ghazi Time) Disposition Final User    08/08/2016 4:33:07 PM Send To RN Personal   Venetia Maxon, RN, Manuela Schwartz    08/08/2016 5:03:27 PM Attempt made - message left   Venetia Maxon, RN, Manuela Schwartz    08/08/2016 5:13:38 PM Paged On Call back to [facility], [place], Manuela Schwartz    08/08/2016 5:13:55 PM Attempt made - message left   Venetia Maxon, RN, Manuela Schwartz      08/08/2016  4:31:39 PM Clinical Call Yes Venetia Maxon, RN, Manuela Schwartz                  --------------------------------------------------------------------------------         Comments  User: Willey Blade, RN Date/Time Eilene Ghazi Time): 08/08/2016 4:26:43 PM  Unable to triage using GL but will call oncall for the following 1. if calf pain using recumbant sit up machine something to be needs to be checked '  User: Willey Blade, RN Date/Time Eilene Ghazi Time): 08/08/2016 4:27:22 PM  Also caller to drive [id number] hrs next week appx 600 + miles. is that ok with DVT and PE>       Paging            DoctorName Phone DateTime Result/Outcome Message Type Notes  Nani Ravens MD [phone] 08/08/2016 5:13:38 PM Called On Call Provider - Left Message Doctor Paged    Nani Ravens, - MD   08/08/2016 5:33:12 PM Spoke with On Call - General Message Result Dr Garret Reddish is on call gave  him report and warm transferred the Dr to the pt

## 2016-08-08 NOTE — Telephone Encounter (Signed)
Still with mild pain behind knee after walking on treadmill or when hooking leg on reverse incline. Advised to avoid activities causing discomfort and try again 1-2 weeks  Long drive [id number] miles- going to get out every hour- very reasonable  Still on xarelto  Still getting some swelling in leg which eventually goes away  Reassurance provided on all the above

## 2016-08-11 NOTE — Telephone Encounter (Signed)
Marin Olp, MD    08/08/16 5:33 PM  Note    Still with mild pain behind knee after walking on treadmill or when hooking leg on reverse incline. Advised to avoid activities causing discomfort and try again 1-2 weeks  Long drive [id number] miles- going to get out every hour- very reasonable  Still on xarelto  Still getting some swelling in leg which eventually goes away  Reassurance provided on all the above

## 2016-09-18 ENCOUNTER — Emergency Department (HOSPITAL_COMMUNITY): Payer: Commercial Managed Care - HMO

## 2016-09-18 ENCOUNTER — Encounter (HOSPITAL_COMMUNITY): Payer: Self-pay | Admitting: Emergency Medicine

## 2016-09-18 ENCOUNTER — Telehealth: Payer: Self-pay | Admitting: Family Medicine

## 2016-09-18 ENCOUNTER — Emergency Department (HOSPITAL_COMMUNITY)
Admission: EM | Admit: 2016-09-18 | Discharge: 2016-09-19 | Disposition: A | Discharge status: Home / Self Care | Payer: Commercial Managed Care - HMO | Attending: Emergency Medicine | Admitting: Emergency Medicine

## 2016-09-18 DIAGNOSIS — Z86718 Personal history of other venous thrombosis and embolism: Secondary | ICD-10-CM | POA: Diagnosis not present

## 2016-09-18 DIAGNOSIS — M79605 Pain in left leg: Secondary | ICD-10-CM | POA: Diagnosis not present

## 2016-09-18 DIAGNOSIS — M7989 Other specified soft tissue disorders: Secondary | ICD-10-CM | POA: Insufficient documentation

## 2016-09-18 DIAGNOSIS — R072 Precordial pain: Secondary | ICD-10-CM | POA: Insufficient documentation

## 2016-09-18 DIAGNOSIS — R079 Chest pain, unspecified: Secondary | ICD-10-CM

## 2016-09-18 DIAGNOSIS — Z7901 Long term (current) use of anticoagulants: Secondary | ICD-10-CM | POA: Insufficient documentation

## 2016-09-18 LAB — CBC
HCT: 42.9 % (ref 39.0–52.0)
HEMOGLOBIN: 14.9 g/dL (ref 13.0–17.0)
MCH: 30.6 pg (ref 26.0–34.0)
MCHC: 34.7 g/dL (ref 30.0–36.0)
MCV: 88.1 fL (ref 78.0–100.0)
PLATELETS: 214 10*3/uL (ref 150–400)
RBC: 4.87 MIL/uL (ref 4.22–5.81)
RDW: 13.1 % (ref 11.5–15.5)
WBC: 6 10*3/uL (ref 4.0–10.5)

## 2016-09-18 LAB — COMPREHENSIVE METABOLIC PANEL
ALK PHOS: 87 U/L (ref 38–126)
ALT: 32 U/L (ref 17–63)
AST: 25 U/L (ref 15–41)
Albumin: 4.7 g/dL (ref 3.5–5.0)
Anion gap: 9 (ref 5–15)
BUN: 18 mg/dL (ref 6–20)
CALCIUM: 9 mg/dL (ref 8.9–10.3)
CHLORIDE: 106 mmol/L (ref 101–111)
CO2: 26 mmol/L (ref 22–32)
CREATININE: 1.03 mg/dL (ref 0.61–1.24)
GFR calc Af Amer: >60 mL/min (ref >60)
Glucose, Bld: 98 mg/dL (ref 65–99)
Potassium: 3.6 mmol/L (ref 3.5–5.1)
Sodium: 141 mmol/L (ref 135–145)
Total Bilirubin: 0.7 mg/dL (ref 0.3–1.2)
Total Protein: 7.2 g/dL (ref 6.5–8.1)

## 2016-09-18 LAB — I-STAT TROPONIN, ED
TROPONIN I, POC: 0 ng/mL (ref 0.00–0.08)
TROPONIN I, POC: 0 ng/mL (ref 0.00–0.08)

## 2016-09-18 MED ORDER — SODIUM CHLORIDE 0.9 % IJ SOLN
INTRAMUSCULAR | Status: AC
  Filled 2016-09-18: qty 50

## 2016-09-18 MED ORDER — IOPAMIDOL (ISOVUE-370) INJECTION 76%
100.0000 mL | Freq: Once | INTRAVENOUS | Status: AC | PRN
  Administered 2016-09-18: 100 mL via INTRAVENOUS

## 2016-09-18 MED ORDER — IOPAMIDOL (ISOVUE-370) INJECTION 76%
INTRAVENOUS | Status: AC
  Filled 2016-09-18: qty 100

## 2016-09-18 NOTE — Telephone Encounter (Signed)
Patient Name: Jonathon [name]  DOB: 08-15-1966    Initial Comment [place] states he had blood clots in lungs a couple of months ago and left leg. He had 50 minute flight yesterday and when he got off of the plane, his legs were swollen. He is now having some discomfort in the center of his chest.   Nurse Assessment      Guidelines    Guideline Title Affirmed Question Affirmed Notes  Leg Swelling and Edema [1] Thigh, calf, or ankle swelling AND [2] only 1 side    Final Disposition User   See Physician within 4 Hours (or PCP triage) Adella Nissen, RN, Leitha Schuller    Comments  Caller states pain less than a 1 on a 1-10 scale.  Caller instructed to go to the ED for evaluation within the next 4 hours, urgent care not appropriate for the evaluation.   Referrals  GO TO FACILITY OTHER - SPECIFY   Disagree/Comply: Comply

## 2016-09-18 NOTE — ED Provider Notes (Signed)
[facility] DEPT Provider Note   CSN: [id number] Arrival date & time: 09/18/16  1739     History   Chief Complaint Chief Complaint  Patient presents with  . Chest Pain  . Leg Swelling    HPI Jonathon [name] is a 50 y.o. male.  The history is provided by the patient.  Chest Pain   Chronicity: similar to recent PE. The current episode started 6 to 12 hours ago. The problem occurs constantly. The pain is present in the substernal region. The pain is mild. The quality of the pain is described as dull. The pain does not radiate. Duration of episode(s) is 6 hours. Associated symptoms include leg pain (left) and lower extremity edema (left). Pertinent negatives include no shortness of breath. Treatments tried: currently on Xarelto.  His past medical history is significant for DVT (left) and PE.  Pertinent negatives for past medical history include no hyperlipidemia, no hypertension and no MI.   1 hr flight last night.  Past Medical History:  Diagnosis Date  . Chicken pox   . Gout   . Left leg DVT ([place]) 07/30/2016  . Pulmonary embolism ([place]) 07/18/2016    Patient Active Problem List   Diagnosis Date Noted  . Left leg DVT ([place]) 07/30/2016  . Gout 07/30/2016  . OSA (obstructive sleep apnea) 07/30/2016  . Essential hypertension 07/30/2016  . Pulmonary embolism ([place]) 07/18/2016    Past Surgical History:  Procedure Laterality Date  . APPENDECTOMY  2003  . CHOLECYSTECTOMY  2002  . HERNIA REPAIR     as child- unsure location  . LASIK     similar to but not lasik per patient  . NASAL SEPTUM SURGERY  1996       Home Medications    Prior to Admission medications   Medication Sig Start Date End Date Taking? Authorizing Provider  allopurinol (ZYLOPRIM) 300 MG tablet Take 300 mg by mouth every morning.    Yes Historical Provider, MD  rivaroxaban (XARELTO) 20 MG TABS tablet Take 1 tablet (20 mg total) by mouth daily with supper. 07/30/16  Yes Marin Olp, MD    Rivaroxaban 15 & 20 MG TBPK Take as directed on package: Start with one 15mg  tablet by mouth twice a day with food. On Day 22, switch to one 20mg  tablet once a day with food. Patient not taking: Reported on 09/18/2016 07/20/16   Florencia Reasons, MD    Family History Family History  Problem Relation Age of Onset  . Heart attack Father 30  . Hypercalcemia Father   . Diabetes Father   . Hypertension Father   . Uterine cancer Mother   . Stroke Paternal Uncle   . Alcohol abuse Paternal Grandfather     Social History Social History  Substance Use Topics  . Smoking status: Never Smoker  . Smokeless tobacco: Never Used  . Alcohol use 3.0 - 3.6 oz/week    5 - 6 Cans of beer per week     Comment: occasional heavy     Allergies   Patient has no known allergies.   Review of Systems Review of Systems  Respiratory: Negative for shortness of breath.   Cardiovascular: Positive for chest pain and leg swelling.  Ten systems are reviewed and are negative for acute change except as noted in the HPI    Physical Exam Updated Vital Signs BP 166/97 (BP Location: Left Arm)   Pulse 71   Temp 98.2 F (36.8 C) (Oral)   Resp  16   Ht 5\' 10"  (1.778 m)   Wt 265 lb (120.2 kg)   SpO2 99%   BMI 38.02 kg/m   Physical Exam  Constitutional: He is oriented to person, place, and time. He appears well-developed and well-nourished. No distress.  HENT:  Head: Normocephalic and atraumatic.  Nose: Nose normal.  Eyes: Conjunctivae and EOM are normal. Pupils are equal, round, and reactive to light. Right eye exhibits no discharge. Left eye exhibits no discharge. No scleral icterus.  Neck: Normal range of motion. Neck supple.  Cardiovascular: Normal rate and regular rhythm.  Exam reveals no gallop and no friction rub.   No murmur heard. Pulmonary/Chest: Effort normal and breath sounds normal. No stridor. No respiratory distress. He has no rales.  Abdominal: Soft. He exhibits no distension. There is no  tenderness.  Musculoskeletal: He exhibits no edema or tenderness.       Left lower leg: He exhibits swelling.  Neurological: He is alert and oriented to person, place, and time.  Skin: Skin is warm and dry. No rash noted. He is not diaphoretic. No erythema.  Psychiatric: He has a normal mood and affect.  Vitals reviewed.    ED Treatments / Results  Labs (all labs ordered are listed, but only abnormal results are displayed) Labs Reviewed  CBC  COMPREHENSIVE METABOLIC PANEL  I-STAT [place], ED  Randolm Idol, ED    EKG  EKG Interpretation  Date/Time:  Thursday September 18 2016 17:54:03 EST Ventricular Rate:  69 PR Interval:    QRS Duration: 100 QT Interval:  392 QTC Calculation: 420 R Axis:   76 Text Interpretation:  Sinus rhythm Baseline wander in lead(s) II aVR V3 V5 No significant change since last tracing Confirmed by [facility] MD, Mitch Arquette ([id number]) on 09/18/2016 7:38:07 PM       Radiology Ct Angio Chest Pe W Or Wo Contrast  Result Date: 09/19/2016 CLINICAL DATA:  Lower extremity swelling after a plane flight. Some central chest discomfort. EXAM: CT ANGIOGRAPHY CHEST WITH CONTRAST TECHNIQUE: Multidetector CT imaging of the chest was performed using the standard protocol during bolus administration of intravenous contrast. Multiplanar CT image reconstructions and MIPs were obtained to evaluate the vascular anatomy. CONTRAST:  100 mL Isovue 370 intravenous COMPARISON:  07/18/2016 FINDINGS: Cardiovascular: No evidence of acute pulmonary embolism. The pulmonary emboli have resolved since 07/18/2016, with only a few intraluminal webs remaining. Thoracic aorta is normal in caliber and intact. Mediastinum/Nodes: No enlarged mediastinal, hilar, or axillary lymph nodes. Thyroid gland, trachea, and esophagus demonstrate no significant findings. Lungs/Pleura: Lungs are clear. No pleural effusion or pneumothorax. Upper Abdomen: No acute abnormality. Musculoskeletal: No chest wall  abnormality. No acute or significant osseous findings. Review of the MIP images confirms the above findings. IMPRESSION: No acute findings. There are a few intraluminal webs within the pulmonary arterial circulation, stigmata of the prior pulmonary emboli. Electronically Signed   By: Andreas Newport M.D.   On: 09/19/2016 00:20    Procedures Procedures (including critical care time)  Medications Ordered in ED Medications  sodium chloride 0.9 % injection (not administered)  iopamidol (ISOVUE-370) 76 % injection (not administered)  iopamidol (ISOVUE-370) 76 % injection (not administered)  iopamidol (ISOVUE-370) 76 % injection 100 mL (100 mLs Intravenous Contrast Given 09/18/16 2336)  enoxaparin (LOVENOX) injection 120 mg (120 mg Subcutaneous Given 09/19/16 0123)     Initial Impression / Assessment and Plan / ED Course  I have reviewed the triage vital signs and the nursing notes.  Pertinent labs &  imaging results that were available during my care of the patient were reviewed by me and considered in my medical decision making (see chart for details).  Clinical Course     Presentation is concerning for recurrent DVT. Obtained a CTA to rule out pulmonary embolism given the chest pain; which was negative. EKG without acute ischemic changes. CTA without evidence of dissection or Esophageal perforation. HEAR <4. Serial troponins negative 2. Feel this is adequate to rule out ACS at this time.  Unable to obtain lower extremity Doppler at this time. Given the concern for possible clot recurrence, patient was given treatment dose of Lovenox in the emergency department and scheduled for lower extremity Doppler in the morning.  The patient is safe for discharge with strict return precautions.   Final Clinical Impressions(s) / ED Diagnoses   Final diagnoses:  Chest pain, unspecified type  Left leg swelling  History of DVT (deep vein thrombosis)   Disposition: Discharge  Condition: Good  I  have discussed the results, Dx and Tx plan with the patient who expressed understanding and agree(s) with the plan. Discharge instructions discussed at great length. The patient was given strict return precautions who verbalized understanding of the instructions. No further questions at time of discharge.    Discharge Medication List as of 09/19/2016  1:01 AM      Follow Up: Marin Olp, MD [place] [address] [phone]  Schedule an appointment as soon as possible for a visit  As needed  [facility] Emergency Dept  Go today at 0800 am for left leg ultrasound.      Fatima Blank, MD 09/19/16 [phone]

## 2016-09-18 NOTE — ED Triage Notes (Signed)
Pt c/o chest discomfort onset this afternoon about 1200. Pt flew on airplane yesterday for about 50 minutes, note left leg swelling after getting off of plane. Hx of PEs.

## 2016-09-19 ENCOUNTER — Other Ambulatory Visit (HOSPITAL_COMMUNITY): Payer: Self-pay | Admitting: Emergency Medicine

## 2016-09-19 ENCOUNTER — Ambulatory Visit (EMERGENCY_DEPARTMENT_HOSPITAL)
Admission: RE | Admit: 2016-09-19 | Discharge: 2016-09-19 | Disposition: A | Discharge status: Home / Self Care | Payer: Commercial Managed Care - HMO | Source: Ambulatory Visit | Attending: Emergency Medicine | Admitting: Emergency Medicine

## 2016-09-19 DIAGNOSIS — M79605 Pain in left leg: Secondary | ICD-10-CM

## 2016-09-19 DIAGNOSIS — M7989 Other specified soft tissue disorders: Secondary | ICD-10-CM | POA: Diagnosis not present

## 2016-09-19 MED ORDER — ENOXAPARIN SODIUM 120 MG/0.8ML ~~LOC~~ SOLN
1.0000 mg/kg | Freq: Once | SUBCUTANEOUS | Status: AC
  Administered 2016-09-19: 120 mg via SUBCUTANEOUS
  Filled 2016-09-19: qty 0.8

## 2016-09-19 NOTE — Telephone Encounter (Signed)
Pt was seen and evaluated/treated in ED. Nothing further needed at this time.

## 2016-09-19 NOTE — Progress Notes (Signed)
*  PRELIMINARY RESULTS* Vascular Ultrasound Left lower extremity venous duplex has been completed.  Preliminary findings: left lower extremity is negative for acute deep vein thrombosis. 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  This area is patent and compressible. No other evidence of thrombosis in the left extremity or right common femoral vein.  Myrtie Cruise Charish Schroepfer 09/19/2016, 10:10 AM

## 2016-10-30 ENCOUNTER — Ambulatory Visit: Payer: Commercial Managed Care - HMO | Admitting: Family Medicine

## 2016-11-06 ENCOUNTER — Encounter: Payer: Self-pay | Admitting: Family Medicine

## 2016-11-06 ENCOUNTER — Ambulatory Visit (INDEPENDENT_AMBULATORY_CARE_PROVIDER_SITE_OTHER): Payer: Commercial Managed Care - HMO | Admitting: Family Medicine

## 2016-11-06 DIAGNOSIS — I2601 Septic pulmonary embolism with acute cor pulmonale: Secondary | ICD-10-CM

## 2016-11-06 DIAGNOSIS — I10 Essential (primary) hypertension: Secondary | ICD-10-CM

## 2016-11-06 NOTE — Assessment & Plan Note (Signed)
S:  Last visit noted home BPS elevated but <140/90 50% of the time. Had been high in office a few times as well. We had planned for 3 month follo wup and work on [employer] plan and weight loss. Also hoping for weight loss because when weight in 200-210 range does not snore (and does not want repeat osa evaluation)  Today, well controlled on no medication. Much more consistent with exercise- 4-5 days a week. Down slightly in last week- tweaked back shoveling snow. Weight is up some- may have had some increase muscle mass but some fat added as well.Realizes he needs to pay more attention to foods he is doing. Drinks a lot of diet soda. Discussed cutting down on this. Tries to do salad at lunch. Dinner few veggies.   Wt Readings from Last 3 Encounters:  11/06/16 273 lb 3.2 oz (123.9 kg)  09/18/16 265 lb (120.2 kg)  07/30/16 267 lb (121.1 kg)   BP Readings from Last 3 Encounters:  11/06/16 126/74  09/19/16 121/62  07/30/16 132/90  A/P:Continue without medicine. Exercise seems to be helping. Counseling as above to help with weight loss

## 2016-11-06 NOTE — Progress Notes (Signed)
Pre visit review using our clinic review tool, if applicable. No additional management support is needed unless otherwise documented below in the visit note. 

## 2016-11-06 NOTE — Assessment & Plan Note (Addendum)
S: compliant with xarelto 20mg  daily. Had a scare in December with chest pain and left leg swelling but CTA negative. Also duplex left leg shows small segment of chronic thrombosis left popliteal likely residual (appears to be improving though). This was after a 50 minute flight. No recurrence of chest pain since that time.  A/P: appears PE has cleared but DVT still present. he has opted not to travel for work at this point- medical exception. We discussed 3 month follow up to check on weight and repeat duplex at that time- consider coming off xarelto at that point and start aspirin. wants to consider xarelto for trips such as 20mg  daily

## 2016-11-06 NOTE — Patient Instructions (Addendum)
Blood pressure better with exercise. Weight still up. Need to make dietary modifications- more water, less other beverages (diet soda, milk), increase veggies to 3-5 servings a day including making half your dinner plate veggies, continue exercise. Consider weight watchers.   Follow up in 3 months. Repeat scan of leg. We could consider xarelto as needed for plane flights in future if insurance would cover

## 2016-11-06 NOTE — Progress Notes (Signed)
Subjective:  Jonathon [name] is a 51 y.o. year old very pleasant male patient who presents for/with See problem oriented charting ROS- no chest pain or shortness of breath.  Mild left leg swelling chronic now. No leg pain.   Past Medical History-  Patient Active Problem List   Diagnosis Date Noted  . Left leg DVT ([place]) 07/30/2016    Priority: High  . Pulmonary embolism ([place]) 07/18/2016    Priority: High  . Gout 07/30/2016    Priority: Medium  . OSA (obstructive sleep apnea) 07/30/2016    Priority: Medium  . Essential hypertension 07/30/2016    Priority: Medium    Medications- reviewed and updated Current Outpatient Prescriptions  Medication Sig Dispense Refill  . allopurinol (ZYLOPRIM) 300 MG tablet Take 300 mg by mouth every morning.     . rivaroxaban (XARELTO) 20 MG TABS tablet Take 1 tablet (20 mg total) by mouth daily with supper. 30 tablet 5   No current facility-administered medications for this visit.     Objective: BP 126/74 (BP Location: Left Arm, Patient Position: Sitting, Cuff Size: Large)   Pulse 75   Temp 98.2 F (36.8 C) (Oral)   Ht 5\' 10"  (1.778 m)   Wt 273 lb 3.2 oz (123.9 kg)   SpO2 94%   BMI 39.20 kg/m  Gen: NAD, resting comfortably CV: RRR no murmurs rubs or gallops Lungs: CTAB no crackles, wheeze, rhonchi Abdomen: soft/nontender/nondistended/normal bowel sounds. No rebound or guarding. 1 cm area around umbilicus liek a raised larger than papule portion- small scabbed over area  Ext: trace edema Skin: warm, dry Neuro: grossly normal, moves all extremities  Assessment/Plan:  Discussed derm referral for area in umbilicus- there for several years. Did have prior surgery and could be some hypertrophied skin. Oral antibiotics or topical in past have never cleared this. j   Essential hypertension S:  Last visit noted home BPS elevated but <140/90 50% of the time. Had been high in office a few times as well. We had planned for 3 month follo wup and  work on [employer] plan and weight loss. Also hoping for weight loss because when weight in 200-210 range does not snore (and does not want repeat osa evaluation)  Today, well controlled on no medication. Much more consistent with exercise- 4-5 days a week. Down slightly in last week- tweaked back shoveling snow. Weight is up some- may have had some increase muscle mass but some fat added as well.Realizes he needs to pay more attention to foods he is doing. Drinks a lot of diet soda. Discussed cutting down on this. Tries to do salad at lunch. Dinner few veggies.   Wt Readings from Last 3 Encounters:  11/06/16 273 lb 3.2 oz (123.9 kg)  09/18/16 265 lb (120.2 kg)  07/30/16 267 lb (121.1 kg)   BP Readings from Last 3 Encounters:  11/06/16 126/74  09/19/16 121/62  07/30/16 132/90  A/P:Continue without medicine. Exercise seems to be helping. Counseling as above to help with weight loss   Pulmonary embolism ([facility]) S: compliant with xarelto 20mg  daily. Had a scare in December with chest pain and left leg swelling but CTA negative. Also duplex left leg shows small segment of chronic thrombosis left popliteal likely residual (appears to be improving though). This was after a 50 minute flight. No recurrence of chest pain since that time.  A/P: appears PE has cleared but DVT still present. he has opted not to travel for work at this point- medical  exception. We discussed 3 month follow up to check on weight and repeat duplex at that time- consider coming off xarelto at that point and start aspirin.    Return in about 3 months (around 02/04/2017).  Return precautions advised.  Garret Reddish, MD

## 2016-12-05 ENCOUNTER — Telehealth: Payer: Self-pay | Admitting: Family Medicine

## 2016-12-05 NOTE — Telephone Encounter (Signed)
What is he using this for? May need appointment

## 2016-12-05 NOTE — Telephone Encounter (Signed)
Pt would like new rx mupirocin cream 2 % [facility] [address]. This med was previously prescribed by old md

## 2016-12-09 MED ORDER — MUPIROCIN 2 % EX OINT: TOPICAL_OINTMENT | CUTANEOUS | 0 refills | Status: DC

## 2016-12-09 NOTE — Telephone Encounter (Signed)
isnt he still taking xarelto? if he is he can travel by plane.

## 2016-12-09 NOTE — Telephone Encounter (Signed)
I sent this in for him. Please inform.

## 2016-12-09 NOTE — Telephone Encounter (Signed)
Called and spoke to patient who verbalized understanding of prescription being sent in. Patient also was asking about a 50 minute flight to [place], [place] on April 16. He states he has blood clots in his legs. He did state he can drive if necessary. Please advise.

## 2016-12-09 NOTE — Telephone Encounter (Signed)
Says he has a couple of sores in his belly button. He says he has seen you for this before. Possible ingrown hairs? He states he had his gallbladder and appendix removed and thinks it is an ingrown hair at the scar site. It is not currently open or draining. He states he has been using this to heal it up and wants to continue to use it until the spots heal completely.

## 2016-12-10 NOTE — Telephone Encounter (Signed)
Called and left a detailed voicemail message 

## 2017-02-01 ENCOUNTER — Other Ambulatory Visit: Payer: Self-pay | Admitting: Family Medicine

## 2017-02-05 ENCOUNTER — Encounter: Payer: Self-pay | Admitting: Family Medicine

## 2017-02-05 ENCOUNTER — Ambulatory Visit (INDEPENDENT_AMBULATORY_CARE_PROVIDER_SITE_OTHER): Payer: 59 | Admitting: Family Medicine

## 2017-02-05 VITALS — BP 120/82 | HR 76 | Temp 98.3°F | Ht 70.0 in | Wt 266.0 lb

## 2017-02-05 DIAGNOSIS — I824Y2 Acute embolism and thrombosis of unspecified deep veins of left proximal lower extremity: Secondary | ICD-10-CM | POA: Diagnosis not present

## 2017-02-05 DIAGNOSIS — Z23 Encounter for immunization: Secondary | ICD-10-CM

## 2017-02-05 DIAGNOSIS — M1009 Idiopathic gout, multiple sites: Secondary | ICD-10-CM | POA: Diagnosis not present

## 2017-02-05 DIAGNOSIS — I10 Essential (primary) hypertension: Secondary | ICD-10-CM

## 2017-02-05 MED ORDER — MUPIROCIN 2 % EX OINT: TOPICAL_OINTMENT | CUTANEOUS | 1 refills | Status: DC

## 2017-02-05 NOTE — Progress Notes (Signed)
Pre visit review using our clinic review tool, if applicable. No additional management support is needed unless otherwise documented below in the visit note. 

## 2017-02-05 NOTE — Assessment & Plan Note (Signed)
S: controlled on no rx onn repeat without medication. Initially mild elevation. Home #s have been reasoable.  Wt Readings from Last 3 Encounters:  02/05/17 266 lb (120.7 kg)  11/06/16 273 lb 3.2 oz (123.9 kg)  09/18/16 265 lb (120.2 kg)    BP Readings from Last 3 Encounters:  02/05/17 120/82  11/06/16 126/74  09/19/16 121/62  A/P:Continue without meds- continue weight loss journey. Thrilled down 7 lbs.

## 2017-02-05 NOTE — Assessment & Plan Note (Signed)
S: compliant with allopurinol 300mg  with no gout flares since 2016.  A/P: continue current medicines.

## 2017-02-05 NOTE — Patient Instructions (Addendum)
We will call you within a week or two about your referral to repeat scan of the legs. If you do not hear within 3 weeks, give [place] a call.   great job on weight loss  Continue xarelto until we get these results back. Even if we do stop we would continue aspirin.

## 2017-02-05 NOTE — Progress Notes (Signed)
Subjective:  Jonathon [name] is a 51 y.o. year old very pleasant male patient who presents for/with See problem oriented charting ROS- mild pain behind left knee. No chest pain or shortness of breath. No headache or blurry vision.    Past Medical History-  Patient Active Problem List   Diagnosis Date Noted  . Left leg DVT ([place]) 07/30/2016    Priority: High  . Pulmonary embolism ([place]) 07/18/2016    Priority: High  . Gout 07/30/2016    Priority: Medium  . OSA (obstructive sleep apnea) 07/30/2016    Priority: Medium  . Essential hypertension 07/30/2016    Priority: Medium    Medications- reviewed and updated Current Outpatient Prescriptions  Medication Sig Dispense Refill  . allopurinol (ZYLOPRIM) 300 MG tablet Take 300 mg by mouth every morning.     . mupirocin ointment (BACTROBAN) 2 % Apply twice daily as needed to belly button 30 g 1  . XARELTO 20 MG TABS tablet TAKE 1 TABLET (20 MG TOTAL) BY MOUTH DAILY WITH SUPPER. 30 tablet 5   No current facility-administered medications for this visit.     Objective: BP 120/82   Pulse 76   Temp 98.3 F (36.8 C) (Oral)   Ht 5\' 10"  (1.778 m)   Wt 266 lb (120.7 kg)   SpO2 93%   BMI 38.17 kg/m  Gen: NAD, resting comfortably CV: RRR no murmurs rubs or gallops Lungs: CTAB no crackles, wheeze, rhonchi Abdomen: obese Ext: no edema Mild pain without significant edema with palpation behind right knee.  Skin: warm, dry, no rash on legs  Assessment/Plan:  Left leg DVT (HCC) S: compliant with xarelto 20mg  today. Has been on for 6 months now (originally DVT/PE after over 2 hour flight in October 2017). PE essentially cleared at 3 months but residual DVT in popliteal vein- opted for 3 month repeat. Last 50 minute pain flight had worsening swelling left leg and thus reason duplex completed.  A/P: update venous duplex given chronic thrombus last check. Continue xarelto for now. I wonder if he will still have chronic dvt considering his  mild pain behind left knee. May need longer term therapy. Offered hematology referral which he declined. Plan is take off xarelto (this is patients wish) if clot cleared and change to aspirin- if recurrence would need lifelong therapy and consider genetic testing considering has 2 children.   Has under 2 hour flight next week- since he is on xarelto did not discourage this. Since he has to travel a lot for work- also wonder about using xarelto as needed for flights though no fda indication for this. Review of uptoddate does mention possible 10mg  dosage for those at increased risk of decrease.   Essential hypertension S: controlled on no rx onn repeat without medication. Initially mild elevation. Home #s have been reasoable.  Wt Readings from Last 3 Encounters:  02/05/17 266 lb (120.7 kg)  11/06/16 273 lb 3.2 oz (123.9 kg)  09/18/16 265 lb (120.2 kg)    BP Readings from Last 3 Encounters:  02/05/17 120/82  11/06/16 126/74  09/19/16 121/62  A/P:Continue without meds- continue weight loss journey. Thrilled down 7 lbs.   Gout S: compliant with allopurinol 300mg  with no gout flares since 2016.  A/P: continue current medicines.   advised verbally 3-6 months CPE  Orders Placed This Encounter  Procedures  . Tdap vaccine greater than or equal to 7yo IM   Uses daily and helps with prevention of umbilical infection- has declined dermatology  referral.  Meds ordered this encounter  Medications  . mupirocin ointment (BACTROBAN) 2 %    Sig: Apply twice daily as needed to belly button    Dispense:  30 g    Refill:  1   Return precautions advised.  Garret Reddish, MD

## 2017-02-05 NOTE — Assessment & Plan Note (Addendum)
S: compliant with xarelto 20mg  today. Has been on for 6 months now (originally DVT/PE after over 2 hour flight in October 2017). PE essentially cleared at 3 months but residual DVT in popliteal vein- opted for 3 month repeat. Last 50 minute pain flight had worsening swelling left leg and thus reason duplex completed.  A/P: update venous duplex given chronic thrombus last check. Continue xarelto for now. I wonder if he will still have chronic dvt considering his mild pain behind left knee. May need longer term therapy. Offered hematology referral which he declined. Plan is take off xarelto (this is patients wish) if clot cleared and change to aspirin- if recurrence would need lifelong therapy and consider genetic testing considering has 2 children.   Has under 2 hour flight next week- since he is on xarelto did not discourage this. Since he has to travel a lot for work- also wonder about using xarelto as needed for flights though no fda indication for this. Review of uptoddate does mention possible 10mg  dosage for those at increased risk of decrease.

## 2017-02-23 ENCOUNTER — Ambulatory Visit (HOSPITAL_COMMUNITY)
Admission: RE | Admit: 2017-02-23 | Discharge: 2017-02-23 | Disposition: A | Discharge status: Home / Self Care | Payer: 59 | Source: Ambulatory Visit | Attending: Internal Medicine | Admitting: Internal Medicine

## 2017-02-23 DIAGNOSIS — I824Y2 Acute embolism and thrombosis of unspecified deep veins of left proximal lower extremity: Secondary | ICD-10-CM | POA: Insufficient documentation

## 2017-02-25 ENCOUNTER — Other Ambulatory Visit: Payer: Self-pay

## 2017-02-25 DIAGNOSIS — I8290 Acute embolism and thrombosis of unspecified vein: Secondary | ICD-10-CM

## 2017-04-08 ENCOUNTER — Ambulatory Visit: Payer: 59

## 2017-04-08 ENCOUNTER — Other Ambulatory Visit (HOSPITAL_BASED_OUTPATIENT_CLINIC_OR_DEPARTMENT_OTHER): Payer: 59

## 2017-04-08 ENCOUNTER — Ambulatory Visit (HOSPITAL_BASED_OUTPATIENT_CLINIC_OR_DEPARTMENT_OTHER): Payer: 59 | Admitting: Hematology & Oncology

## 2017-04-08 VITALS — BP 146/93 | HR 68 | Temp 98.4°F | Resp 18 | Wt 272.0 lb

## 2017-04-08 DIAGNOSIS — I2602 Saddle embolus of pulmonary artery with acute cor pulmonale: Secondary | ICD-10-CM

## 2017-04-08 DIAGNOSIS — I82432 Acute embolism and thrombosis of left popliteal vein: Secondary | ICD-10-CM | POA: Diagnosis not present

## 2017-04-08 DIAGNOSIS — I2699 Other pulmonary embolism without acute cor pulmonale: Secondary | ICD-10-CM

## 2017-04-08 DIAGNOSIS — I82412 Acute embolism and thrombosis of left femoral vein: Secondary | ICD-10-CM

## 2017-04-08 LAB — CMP (CANCER CENTER ONLY)
ALK PHOS: 149 U/L — AB (ref 26–84)
ALT: 44 U/L (ref 10–47)
AST: 31 U/L (ref 11–38)
Albumin: 3.8 g/dL (ref 3.3–5.5)
BILIRUBIN TOTAL: 0.7 mg/dL (ref 0.20–1.60)
BUN: 21 mg/dL (ref 7–22)
CALCIUM: 9.5 mg/dL (ref 8.0–10.3)
CO2: 25 mEq/L (ref 18–33)
Chloride: 108 mEq/L (ref 98–108)
Creat: 0.9 mg/dl (ref 0.6–1.2)
GLUCOSE: 91 mg/dL (ref 73–118)
POTASSIUM: 3.9 meq/L (ref 3.3–4.7)
Sodium: 141 mEq/L (ref 128–145)
TOTAL PROTEIN: 6.9 g/dL (ref 6.4–8.1)

## 2017-04-08 LAB — CBC WITH DIFFERENTIAL (CANCER CENTER ONLY)
BASO#: 0 10*3/uL (ref 0.0–0.2)
BASO%: 0.2 % (ref 0.0–2.0)
EOS%: 5.3 % (ref 0.0–7.0)
Eosinophils Absolute: 0.4 10*3/uL (ref 0.0–0.5)
HEMATOCRIT: 42.6 % (ref 38.7–49.9)
HEMOGLOBIN: 14.6 g/dL (ref 13.0–17.1)
LYMPH#: 1.8 10*3/uL (ref 0.9–3.3)
LYMPH%: 27.4 % (ref 14.0–48.0)
MCH: 30.6 pg (ref 28.0–33.4)
MCHC: 34.3 g/dL (ref 32.0–35.9)
MCV: 89 fL (ref 82–98)
MONO#: 0.7 10*3/uL (ref 0.1–0.9)
MONO%: 10.5 % (ref 0.0–13.0)
NEUT%: 56.6 % (ref 40.0–80.0)
NEUTROS ABS: 3.7 10*3/uL (ref 1.5–6.5)
Platelets: 169 10*3/uL (ref 145–400)
RBC: 4.77 10*6/uL (ref 4.20–5.70)
RDW: 13.2 % (ref 11.1–15.7)
WBC: 6.6 10*3/uL (ref 4.0–10.0)

## 2017-04-08 NOTE — Progress Notes (Signed)
Referral MD  Reason for Referral: Pulmonary embolism and left femoral and popliteal vein thrombus   Chief Complaint  Patient presents with  . New Patient (Initial Visit)  : I have blood clots.  HPI: Jonathon [name] is a very nice 51 year old white male. He is originally from [place], [place]. We had a great time talking about [place]. He likes hockey. We had a great time talking about hockey and the recent [employer].  He works for the [employer]. He does travel a lot.  Apparently, last year, he was traveling. He began to develop some pain in his left leg. He came going to his family doctor. Nothing was done. The satellite there was some thoughts that he may have had gout.  Finally, he said a friend call him. His friend recently had a blood clot. His friend felt that the symptoms that Mr. Domangue was having was very similar to his. As such, he told Mr. Egger to be seen in the emergency room. He was complaining of some chest discomfort. He had elevated d-dimer. He subsequently had a CT angiogram. This showed bilateral pulmonary emboli. These were and the lobar and segmental branches. There was some right heart strain.  He had a Doppler of his legs. He was found had an acute thrombus of the left femoral left popliteal vein.  He was admitted. He was on a heparin drip. He was then converted over to Xarelto.  He had a follow-up CT angiogram in December 2017. This showed no residual pulmonary emboli. He had some changes which are consistent with prior emboli.  He had another Doppler of his left leg. 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  He has occasional swelling. This seems to happen when he is on his feet a lot.  He still travels.  He does not smoke. There is no family history of thromboembolic disease. He says his father was on blood therapy but he is not sure why he was on it. There is no diabetes. He does not take testosterone  supplementation. He is not taking any steroids.  He was referred to the [facility]. I think he may have requested to be seen by hematologist.  Overall, his performance status is ECOG 0.   Past Medical History:  Diagnosis Date  . Chicken pox   . Gout   . Left leg DVT ([place]) 07/30/2016  . Pulmonary embolism ([place]) 07/18/2016  :  Past Surgical History:  Procedure Laterality Date  . APPENDECTOMY  2003  . CHOLECYSTECTOMY  2002  . HERNIA REPAIR     as child- unsure location  . LASIK     similar to but not lasik per patient  . NASAL SEPTUM SURGERY  1996  :   Current Outpatient Prescriptions:  .  allopurinol (ZYLOPRIM) 300 MG tablet, Take 300 mg by mouth every morning. , Disp: , Rfl:  .  mupirocin ointment (BACTROBAN) 2 %, Apply twice daily as needed to belly button, Disp: 30 g, Rfl: 1 .  XARELTO 20 MG TABS tablet, TAKE 1 TABLET (20 MG TOTAL) BY MOUTH DAILY WITH SUPPER., Disp: 30 tablet, Rfl: 5:  :  No Known Allergies:  Family History  Problem Relation Age of Onset  . Heart attack Father 11  . Hypercalcemia Father   . Diabetes Father   . Hypertension Father   . Uterine cancer Mother   . Stroke Paternal Uncle   . Alcohol abuse Paternal Grandfather   :  Social History   Social History  . Marital status: Divorced    Spouse name: N/A  . Number of children: N/A  . Years of education: N/A   Occupational History  . Not on file.   Social History Main Topics  . Smoking status: Never Smoker  . Smokeless tobacco: Never Used  . Alcohol use 3.0 - 3.6 oz/week    5 - 6 Cans of beer per week     Comment: occasional heavy  . Drug use: No  . Sexual activity: Not on file   Other Topics Concern  . Not on file   Social History Narrative   Divorced. Daughter Elmyra Ricks (my patient). Lives with son matthew 62 in 2017. Also has family friend going through divorce living with him.       [employer] for [employer].    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  Exam:Well-developed and well-nourished white male in no obvious distress. He is somewhat obese. Vital signs are temperature of 98.4. Pulse 60. Blood pressure 146/93. Weight is 272 pounds. Head exam shows no ocular or oral lesions. There are no palpable cervical or supraclavicular lymph nodes. Lungs are clear bilaterally. Cardiac exam regular rate and rhythm with no murmurs, rubs or bruits. Abdomen is soft. He is obese. He has good bowel sounds. There is no fluid wave. There is no palpable liver or spleen tip. Back exam shows no tenderness over the spine, ribs or hips. Extremities shows no clubbing, cyanosis or edema. There might be some slight nonpitting edema of the left lower leg. No venous cord is noted in the legs. Has a negative Homans sign. Skin exam shows no rashes, ecchymoses or petechia. Neurological exam shows no focal neurological deficits.    Recent Labs  04/08/17 1126  WBC 6.6  HGB 14.6  HCT 42.6  PLT 169    Recent Labs  04/08/17 1126  NA 141  K 3.9  CL 108  CO2 25  GLUCOSE 91  BUN 21  CREATININE 0.9  CALCIUM 9.5    Blood smear review:  None  Pathology: None     Assessment and Plan:  Mr. Bolotin is a nice 51 year old white male. 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  I'm absolutely surprised that no one has done a hypercoagulable workup on him. I think this would be very helpful. I know that there is no family history of blood clots is still, he is not that old. He does not have any obvious risk factors. I know he does travel a lot.  We will see what his hypercoagulable panel shows.  As far as length of anticoagulation, I would keep him on full dose Xarelto for one year. This will and in October 2018. After that, I would put him on maintenance dose of 10 mg a day.  I told him that a compression stocking would be quite helpful. I worry about him  developing post phlebitic syndrome. I explained what this was. I gave him a prescription for a measured compression stocking. I told him to wear this when he travels and 3 or 4 days a week when he is working.  I spent about 45 minutes with him. I answered his questions. He was very nice.  I will see him back in 3 months. I want to get a Doppler of his left leg well we see  him back.

## 2017-04-09 LAB — PROTEIN S, TOTAL: Protein S, Total: 88 % (ref 60–150)

## 2017-04-09 LAB — PROTEIN C ACTIVITY: Protein C-Functional: 120 % (ref 73–180)

## 2017-04-09 LAB — ANTITHROMBIN III: Antithrombin Activity: 104 % (ref 75–135)

## 2017-04-09 LAB — D-DIMER, QUANTITATIVE: D-DIMER: 0.49 mg/L FEU (ref 0.00–0.49)

## 2017-04-09 LAB — PROTEIN S ACTIVITY: Protein S-Functional: 134 % (ref 63–140)

## 2017-04-10 LAB — CARDIOLIPIN ANTIBODIES, IGG, IGM, IGA
Anticardiolipin Ab,IgG,Qn: <9 GPL U/mL (ref 0–14)
Anticardiolipin Ab,IgM,Qn: <9 MPL U/mL (ref 0–12)

## 2017-04-10 LAB — LUPUS ANTICOAGULANT PANEL
DRVVT MIX: 46.4 s (ref 0.0–47.0)
DRVVT: 64 s — AB (ref 0.0–47.0)
PTT-LA: 34.3 s (ref 0.0–51.9)

## 2017-04-10 LAB — BETA-2-GLYCOPROTEIN I ABS, IGG/M/A
Beta-2 Glyco 1 IgA: <9 GPI IgA units (ref 0–25)
Beta-2 Glycoprotein I Ab, IgG: <9 GPI IgG units (ref 0–20)

## 2017-04-10 LAB — PROTEIN C, TOTAL: Protein C Antigen: 104 % (ref 60–150)

## 2017-04-13 LAB — PROTHROMBIN GENE MUTATION

## 2017-04-13 LAB — FACTOR 5 LEIDEN

## 2017-06-26 ENCOUNTER — Encounter: Payer: Self-pay | Admitting: Family Medicine

## 2017-07-15 ENCOUNTER — Ambulatory Visit (HOSPITAL_BASED_OUTPATIENT_CLINIC_OR_DEPARTMENT_OTHER): Payer: 59 | Admitting: Hematology & Oncology

## 2017-07-15 ENCOUNTER — Ambulatory Visit (HOSPITAL_BASED_OUTPATIENT_CLINIC_OR_DEPARTMENT_OTHER)
Admission: RE | Admit: 2017-07-15 | Discharge: 2017-07-15 | Disposition: A | Discharge status: Home / Self Care | Payer: 59 | Source: Ambulatory Visit | Attending: Hematology & Oncology | Admitting: Hematology & Oncology

## 2017-07-15 ENCOUNTER — Other Ambulatory Visit (HOSPITAL_BASED_OUTPATIENT_CLINIC_OR_DEPARTMENT_OTHER): Payer: 59

## 2017-07-15 VITALS — BP 145/94 | HR 63 | Temp 98.3°F | Resp 18 | Wt 273.0 lb

## 2017-07-15 DIAGNOSIS — I82412 Acute embolism and thrombosis of left femoral vein: Secondary | ICD-10-CM

## 2017-07-15 DIAGNOSIS — I82532 Chronic embolism and thrombosis of left popliteal vein: Secondary | ICD-10-CM

## 2017-07-15 DIAGNOSIS — I82432 Acute embolism and thrombosis of left popliteal vein: Secondary | ICD-10-CM

## 2017-07-15 DIAGNOSIS — I2602 Saddle embolus of pulmonary artery with acute cor pulmonale: Secondary | ICD-10-CM | POA: Insufficient documentation

## 2017-07-15 DIAGNOSIS — I2782 Chronic pulmonary embolism: Secondary | ICD-10-CM

## 2017-07-15 LAB — CBC WITH DIFFERENTIAL (CANCER CENTER ONLY)
BASO#: 0 10*3/uL (ref 0.0–0.2)
BASO%: 0.4 % (ref 0.0–2.0)
EOS%: 6.6 % (ref 0.0–7.0)
Eosinophils Absolute: 0.3 10*3/uL (ref 0.0–0.5)
HCT: 44.7 % (ref 38.7–49.9)
HGB: 15.7 g/dL (ref 13.0–17.1)
LYMPH#: 1.6 10*3/uL (ref 0.9–3.3)
LYMPH%: 32.4 % (ref 14.0–48.0)
MCH: 31.2 pg (ref 28.0–33.4)
MCHC: 35.1 g/dL (ref 32.0–35.9)
MCV: 89 fL (ref 82–98)
MONO#: 0.5 10*3/uL (ref 0.1–0.9)
MONO%: 9.8 % (ref 0.0–13.0)
NEUT#: 2.5 10*3/uL (ref 1.5–6.5)
NEUT%: 50.8 % (ref 40.0–80.0)
PLATELETS: 192 10*3/uL (ref 145–400)
RBC: 5.03 10*6/uL (ref 4.20–5.70)
RDW: 13.4 % (ref 11.1–15.7)
WBC: 4.8 10*3/uL (ref 4.0–10.0)

## 2017-07-15 NOTE — Progress Notes (Signed)
Hematology and Oncology Follow Up Visit  REAGEN HABERMAN [id number] 04-22-1966 51 y.o. 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     Interim History:  Mr. Elsen is back for a second office visit. We did go ahead and do a Doppler of his left leg today. 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 This is improved.  We did do a hypercoagulable workup on him. Everything came back normal.  He is doing well. His left leg is not swollen. He does wear a compression stocking on occasion.  She's had no bleeding. He's had no cough or shortness of breath. He has occasional chest wall discomfort. This might be from exercising.  There's been no fever. He's had no rashes. He's had no nausea or vomiting. He's had no change in bowel or bladder habits.  Overall, his performance status is ECOG 0.  Medications:  Current Outpatient Prescriptions:  .  allopurinol (ZYLOPRIM) 300 MG tablet, Take 300 mg by mouth every morning. , Disp: , Rfl:  .  mupirocin ointment (BACTROBAN) 2 %, Apply twice daily as needed to belly button, Disp: 30 g, Rfl: 1 .  XARELTO 20 MG TABS tablet, TAKE 1 TABLET (20 MG TOTAL) BY MOUTH DAILY WITH SUPPER., Disp: 30 tablet, Rfl: 5  Allergies: No Known Allergies  Past Medical History, Surgical history, Social history, and Family History were reviewed and updated.  Review of Systems: Review of Systems  Constitutional: Negative for appetite change, fatigue, fever and unexpected weight change.  HENT:   Negative for lump/mass, mouth sores, sore throat and trouble swallowing.   Respiratory: Negative for cough, hemoptysis and shortness of breath.   Cardiovascular: Negative for leg swelling and palpitations.  Gastrointestinal: Negative for abdominal distention, abdominal pain,  blood in stool, constipation, diarrhea, nausea and vomiting.  Genitourinary: Negative for bladder incontinence, dysuria, frequency and hematuria.   Musculoskeletal: Negative for arthralgias, back pain, gait problem and myalgias.  Skin: Negative for itching and rash.  Neurological: Negative for dizziness, extremity weakness, gait problem, headaches, numbness, seizures and speech difficulty.  Hematological: Does not bruise/bleed easily.  Psychiatric/Behavioral: Negative for depression and sleep disturbance. The patient is not nervous/anxious.     Physical Exam:  weight is 273 lb (123.8 kg). His oral temperature is 98.3 F (36.8 C). His blood pressure is 145/94 (abnormal) and his pulse is 63. His respiration is 18 and oxygen saturation is 99%.   Wt Readings from Last 3 Encounters:  07/15/17 273 lb (123.8 kg)  04/08/17 272 lb (123.4 kg)  02/05/17 266 lb (120.7 kg)    Physical Exam  Constitutional: He is oriented to person, place, and time.  HENT:  Head: Normocephalic and atraumatic.  Mouth/Throat: Oropharynx is clear and moist.  Eyes: Pupils are equal, round, and reactive to light. EOM are normal.  Neck: Normal range of motion.  Cardiovascular: Normal rate, regular rhythm and normal heart sounds.   Pulmonary/Chest: Effort normal and breath sounds normal.  Abdominal: Soft. Bowel sounds are normal.  Musculoskeletal: Normal range of motion. He exhibits no edema, tenderness or deformity.  Lymphadenopathy:    He has no cervical adenopathy.  Neurological: He is alert and oriented to person, place, and time.  Skin: Skin is warm and dry. No rash noted. No erythema.  Psychiatric: He has a normal mood and affect. His behavior is normal. Judgment and thought content normal.  Vitals reviewed.    Lab Results  Component Value Date   WBC 4.8 07/15/2017   HGB 15.7 07/15/2017   HCT 44.7 07/15/2017   MCV 89 07/15/2017   PLT 192 07/15/2017     Chemistry      Component Value Date/Time   NA  141 04/08/2017 1126   K 3.9 04/08/2017 1126   CL 108 04/08/2017 1126   CO2 25 04/08/2017 1126   BUN 21 04/08/2017 1126   CREATININE 0.9 04/08/2017 1126      Component Value Date/Time   CALCIUM 9.5 04/08/2017 1126   ALKPHOS 149 (H) 04/08/2017 1126   AST 31 04/08/2017 1126   ALT 44 04/08/2017 1126   BILITOT 0.70 04/08/2017 1126         Impression and Plan: Mr. Crumpler is a 51 year old white male. He has an idiopathic thrombus in the left leg. This is improving.  We will go ahead and get him on 10 mg of Xarelto daily now.  I would like to see him back in 6 months. We'll see him back, we will repeat his Doppler and see how it looks.  He knows to call us if he has any problems before 6 months.   Volanda Napoleon, MD 10/3/201812:44 PM

## 2017-07-16 LAB — D-DIMER, QUANTITATIVE: D-DIMER: 0.68 mg/L FEU — ABNORMAL HIGH (ref 0.00–0.49)

## 2017-08-12 ENCOUNTER — Telehealth: Payer: Self-pay | Admitting: Family Medicine

## 2017-08-12 ENCOUNTER — Other Ambulatory Visit: Payer: Self-pay

## 2017-08-12 MED ORDER — RIVAROXABAN 20 MG PO TABS: 20.0000 mg | ORAL_TABLET | Freq: Every day | ORAL | 5 refills | Status: DC

## 2017-08-12 NOTE — Telephone Encounter (Signed)
Prescription sent to pharmacy as requested.

## 2017-08-12 NOTE — Telephone Encounter (Signed)
MEDICATION: XARELTO 20 MG TABS tablet  PHARMACY:   [id number] [address], [place] (Phone) [phone] (Fax)   IS THIS A 90 DAY SUPPLY : no  IS PATIENT OUT OF MEDICATION: unknown   IF NOT; HOW MUCH IS LEFT: n/a  LAST APPOINTMENT DATE: @4 /26/18  NEXT APPOINTMENT DATE:@Visit  date not found  OTHER COMMENTS: [facility] since 10/27. Please advise.    **Let patient know to contact pharmacy at the end of the day to make sure medication is ready. **  ** Please notify patient to allow 48-72 hours to process**  **Encourage patient to contact the pharmacy for refills or they can request refills through [facility]**

## 2017-08-20 ENCOUNTER — Encounter: Payer: Self-pay | Admitting: Hematology & Oncology

## 2017-08-21 ENCOUNTER — Other Ambulatory Visit: Payer: Self-pay

## 2017-08-21 MED ORDER — RIVAROXABAN 10 MG PO TABS: 10.0000 mg | ORAL_TABLET | Freq: Every day | ORAL | 4 refills | Status: DC

## 2017-10-29 ENCOUNTER — Ambulatory Visit: Payer: 59 | Admitting: Sports Medicine

## 2017-10-29 ENCOUNTER — Encounter: Payer: Self-pay | Admitting: Sports Medicine

## 2017-10-29 ENCOUNTER — Ambulatory Visit (INDEPENDENT_AMBULATORY_CARE_PROVIDER_SITE_OTHER): Payer: 59

## 2017-10-29 ENCOUNTER — Ambulatory Visit: Payer: Self-pay

## 2017-10-29 VITALS — BP 142/90 | HR 73 | Ht 70.0 in | Wt 281.8 lb

## 2017-10-29 DIAGNOSIS — I82412 Acute embolism and thrombosis of left femoral vein: Secondary | ICD-10-CM | POA: Diagnosis not present

## 2017-10-29 DIAGNOSIS — M25461 Effusion, right knee: Secondary | ICD-10-CM | POA: Diagnosis not present

## 2017-10-29 DIAGNOSIS — M25561 Pain in right knee: Secondary | ICD-10-CM | POA: Diagnosis not present

## 2017-10-29 DIAGNOSIS — M7150 Other bursitis, not elsewhere classified, unspecified site: Secondary | ICD-10-CM

## 2017-10-29 MED ORDER — DICLOFENAC SODIUM 2 % TD SOLN: 1.0000 "application " | Freq: Two times a day (BID) | TRANSDERMAL | 2 refills | Status: DC

## 2017-10-29 MED ORDER — DICLOFENAC SODIUM 2 % TD SOLN: 1.0000 "application " | Freq: Two times a day (BID) | TRANSDERMAL | 0 refills | Status: AC

## 2017-10-29 NOTE — Progress Notes (Signed)
Jonathon [name]. Jonathon [name], Jonathon [name] [facility]  Jonathon [name] - 52 y.o. male MRN [id number]  Date of birth: May 31, 1966  Visit Date: 10/29/2017  PCP: Jonathon Olp, MD   Referred by: Jonathon Olp, MD   Scribe for today's visit: Jonathon [name], CMA     SUBJECTIVE:  Jonathon [name] is here for New Patient (Initial Visit) (RT knee pain and swelling) .   His RT knee pain and swelling symptoms INITIALLY: Began 3 weeks and was first noticed after kneeling to put things in a cupboard. He felt a pop in the knee. The pain got worse this past Tuesday after staying on the climber for 35 minutes Monday. Pain is mostly infrapatellar.  Described as moderate swelling and stabbing, nonradiating Worsened with standing after sitting for an extended period of time. Tuesday he was unable to bend his knee. At times he does feel like his knee may give out on him.  Improved with rest Additional associated symptoms include: Pt has experience this is the past but cannot recall which knee, and he had the knee drained.     At this time symptoms are worsening compared to onset  He has been icing the knee nightly with some relief. He has tried taking Aleve with minimal relief. He stopped taking the Aleve because he is on Xarelto.   *Hx of DVT in LLE 07/2016. Pt denies swelling, erythema, and increased warmth in RLE.  On Xarelto.    ROS Reports night time disturbances. He has been sleeping with a pillow between knees.  Denies fevers, chills, or night sweats. Denies unexplained weight loss. Denies personal history of cancer. Denies changes in bowel or bladder habits. Denies recent unreported falls. Denies new or worsening dyspnea or wheezing. Denies headaches or dizziness.  Denies numbness, tingling or weakness  In the extremities.  Denies dizziness or presyncopal episodes Reports lower extremity edema, RT knee.     HISTORY &  PERTINENT PRIOR DATA:  Prior History reviewed and updated per electronic medical record.  Significant history, findings, studies and interim changes include:  reports that  has never smoked. he has never used smokeless tobacco. No results for input(s): HGBA1C, LABURIC, CREATINE in the last 8760 hours. No specialty comments available. No problems updated.  OBJECTIVE:  VS:  HT:5\' 10"  (177.8 cm)   WT:281 lb 12.8 oz (127.8 kg)  BMI:40.43    BP:(!) 142/90  HR:73bpm  TEMP: ( )  RESP:93 %   PHYSICAL EXAM: Constitutional: WDWN, Non-toxic appearing. Psychiatric: Alert & appropriately interactive.  Not depressed or anxious appearing. Respiratory: No increased work of breathing.  Trachea Midline Eyes: Pupils are equal.  EOM intact without nystagmus.  No scleral icterus  NEUROVASCULAR exam: No clubbing or cyanosis appreciated No significant venous stasis changes Capillary Refill: normal, less than 2 seconds    LOWER Extremities  SWELLING Pre-tibial edema: No significant pretibial edema  PULSES Pedal Pulses: Normal & symmetrically palpable  SENSORY Dermatomes intact to light touch  MOTOR Normal strength in all myotomes  REFLEXES Reflexes: Normal & symmetric DTRs    Right Knee  Alignment & Contours: normal Skin: Small amount of generalized swelling with no significant skin breakdown Effusion: yes and mild Generalized Synovitis: mild Knee Tenderness: Patella and prepatellar bursa. Gait: normal   RANGE OF MOTION & STRENGTH  EXTENSION: Normal  with no pain Strength: Normal FLEXION: Normal with no pain Strength: Normal   LIGAMENTOUS TESTING  Varus &  Valgus Strain: stable to testing Anterior & Posterior Drawer: stable to testing: Lachman's: stable to testing   SPECIALITY TESTING:  Patellar Apprehension: Yes Mcmurray's: Negative Thessaly: Positive     No additional findings.   ASSESSMENT & PLAN:   1. Pain and swelling of right knee   2. Traumatic bursitis   3. Deep vein  thrombosis (DVT) of femoral vein of left lower extremity, unspecified chronicity (HCC)    PLAN: Given the mechanism symptoms are most consistent with a traumatic bursitis given the marked prepatellar swelling and infrapatellar bursal irritation.  He does have some degree of intra-articular effusion but this is mild in symptoms do seem to correlate more with the direct area of inflammation.  I would like to try him on topical anti-inflammatories as well as rest ice and pression and check on his progress in 2 weeks and if any lack of improvement intra-articular injection versus direct bursal injection could be considered at that time.  Discussed options with him today in detail.  Avoid systemic NSAIDs due to prior DVT and current Xarelto use.  No problem-specific Assessment & Plan notes found for this encounter.   ++++++++++++++++++++++++++++++++++++++++++++ Orders & Meds: Orders Placed This Encounter  Procedures  . [place] LIMITED JOINT SPACE STRUCTURES LOW RIGHT(NO LINKED CHARGES)  . DG Knee AP/LAT W/Sunrise Right    Meds ordered this encounter  Medications  . Diclofenac Sodium (PENNSAID) 2 % SOLN    Sig: Place 1 application onto the skin 2 (two) times daily for 1 day.    Dispense:  8 g    Refill:  0  . Diclofenac Sodium (PENNSAID) 2 % SOLN    Sig: Place 1 application onto the skin 2 (two) times daily.    Dispense:  112 g    Refill:  2    Home Phone      [phone] Mobile          [phone]     ++++++++++++++++++++++++++++++++++++++++++++ Follow-up: Return in about 2 weeks (around 11/12/2017).   Pertinent documentation may be included in additional procedure notes, imaging studies, problem based documentation and patient instructions. Please see these sections of the encounter for additional information regarding this visit. CMA/ATC served as Education administrator during this visit. History, Physical, and Plan performed by medical provider. Documentation and orders reviewed and attested to.       Jonathon [name], [facility] Physician

## 2017-10-29 NOTE — Procedures (Signed)
LIMITED MSK ULTRASOUND OF right knee Images were obtained and interpreted by myself, Teresa Coombs, DO  Images have been saved and stored to PACS system. Images obtained on: GE S7 Ultrasound machine  FINDINGS:  Patella & Patellar Tendon: Marked swelling and prepatellar as well as infrapatellar bursal swelling. Quad & Quad Tendon: Normal Suprapatellar Pouch: Small to moderate effusion with generalized synovitis that is mild Medial Joint Line: Normal medial meniscus Lateral Joint Line: Normal lateral meniscus Trochlea: n/a Posterior knee: n/a  IMPRESSION:  1. Prepatellar swelling and bursitis consistent with traumatic bursitis 2. Small knee effusion but minimal.

## 2017-11-12 ENCOUNTER — Ambulatory Visit: Payer: Self-pay

## 2017-11-12 ENCOUNTER — Encounter: Payer: Self-pay | Admitting: Sports Medicine

## 2017-11-12 ENCOUNTER — Ambulatory Visit: Payer: 59 | Admitting: Sports Medicine

## 2017-11-12 VITALS — BP 150/98 | HR 86 | Ht 70.0 in | Wt 283.8 lb

## 2017-11-12 DIAGNOSIS — M25561 Pain in right knee: Secondary | ICD-10-CM

## 2017-11-12 DIAGNOSIS — I82412 Acute embolism and thrombosis of left femoral vein: Secondary | ICD-10-CM | POA: Diagnosis not present

## 2017-11-12 DIAGNOSIS — M7150 Other bursitis, not elsewhere classified, unspecified site: Secondary | ICD-10-CM | POA: Insufficient documentation

## 2017-11-12 NOTE — Assessment & Plan Note (Signed)
He is doing significantly better but still has a small amount of bursal fluid on ultrasound.  We will have him continue with compression and topical Pennsaid for the next 2 weeks and compression for the next 4 and plan to re-ultrasound him at that time to ensure clinical resolution.  Cautioned on avoiding direct contact of the knee as this may exacerbate his symptoms and have him return.  If any return or worsening symptoms will plan to aspirate/inject this but given the increased risk of introducing infection and overall good improvement will defer at this time.  He is on Xarelto and avoiding systemic anti-inflammatories is recommended.  Overall good improvement in the rest of his knee exam.

## 2017-11-12 NOTE — Procedures (Signed)
LIMITED MSK ULTRASOUND OF right knee Images were obtained and interpreted by myself, Teresa Coombs, DO  Images have been saved and stored to PACS system. Images obtained on: GE S7 Ultrasound machine  FINDINGS:   Limited views obtained today of the prepatellar and infrapatellar bursa that show improved surrounding subcutaneous edema with persistent small bursal formation, slightly smaller than in the past  IMPRESSION:  1. Improving prepatellar and infrapatellar bursitis

## 2017-11-12 NOTE — Progress Notes (Signed)
Juanda Bond. Pamlea Finder, [facility]  ERLE GUSTER - 52 y.o. male MRN [id number]  Date of birth: 1966-06-17  Visit Date: 11/12/2017  PCP: Marin Olp, MD   Referred by: Marin Olp, MD   Scribe for today's visit: Josepha Pigg, CMA     SUBJECTIVE:  Jonathon [name] is here for Follow-up (RT knee pain/swelling)  His RT knee pain and swelling symptoms INITIALLY: Began 3 weeks and was first noticed after kneeling to put things in a cupboard. He felt a pop in the knee. The pain got worse this past Tuesday after staying on the climber for 35 minutes Monday. Pain is mostly infrapatellar.  Described as moderate swelling and stabbing, nonradiating Worsened with standing after sitting for an extended period of time. Tuesday he was unable to bend his knee. At times he does feel like his knee may give out on him.  Improved with rest Additional associated symptoms include: Pt has experience this is the past but cannot recall which knee, and he had the knee drained.    *Hx of DVT in LLE 07/2016. Pt denies swelling, erythema, and increased warmth in RLE.  On Xarelto.   Compared to the last office visit, his previously described symptoms are improving, sx have improved drastically over the past 2 days.  Current symptoms are mild & are non-radiating. He denies swelling around the knee.  He has been icing the knee and using Pennsaid with some relief.    Xray RT knee done 10/29/2017: IMPRESSION: Small joint effusion.  No acute fracture.   ROS Denies night time disturbances. Denies fevers, chills, or night sweats. Denies unexplained weight loss. Denies personal history of cancer. Denies changes in bowel or bladder habits. Denies recent unreported falls. Denies new or worsening dyspnea or wheezing. Denies headaches or dizziness.  Denies numbness, tingling or weakness  In the extremities.  Denies dizziness or  presyncopal episodes Denies lower extremity edema     HISTORY & PERTINENT PRIOR DATA:  Prior History reviewed and updated per electronic medical record.  Significant history, findings, studies and interim changes include:  reports that  has never smoked. he has never used smokeless tobacco. No results for input(s): HGBA1C, LABURIC, CREATINE in the last 8760 hours. No specialty comments available. Problem  Traumatic Bursitis    OBJECTIVE:  VS:  HT:5\' 10"  (177.8 cm)   WT:283 lb 12.8 oz (128.7 kg)  BMI:40.72    BP:(!) 150/98  HR:86bpm  TEMP: ( )  RESP:94 %   PHYSICAL EXAM: Constitutional: WDWN, Non-toxic appearing. Psychiatric: Alert & appropriately interactive.  Not depressed or anxious appearing. Respiratory: No increased work of breathing.  Trachea Midline Eyes: Pupils are equal.  EOM intact without nystagmus.  No scleral icterus  NEUROVASCULAR exam: No clubbing or cyanosis appreciated No significant venous stasis changes Capillary Refill: normal, less than 2 seconds   Right knee is overall well aligned.  He is a small amount of dryness of the skin associated with Pennsaid use but overall no significant skin breakdown.  He has a more focal area of swelling directly over the patella with a very small amount of fluctuance but no surrounding erythema.  He has good flexion-extension.  No significant pretibial edema and no intra-articular effusion.  He is ligamentously stable.  No pain with McMurray's  No additional findings.   ASSESSMENT & PLAN:   1. Right knee pain, unspecified chronicity   2. Deep vein thrombosis (  DVT) of femoral vein of left lower extremity, unspecified chronicity (HCC)   3. Traumatic bursitis    PLAN:    Traumatic bursitis He is doing significantly better but still has a small amount of bursal fluid on ultrasound.  We will have him continue with compression and topical Pennsaid for the next 2 weeks and compression for the next 4 and plan to re-ultrasound  him at that time to ensure clinical resolution.  Cautioned on avoiding direct contact of the knee as this may exacerbate his symptoms and have him return.  If any return or worsening symptoms will plan to aspirate/inject this but given the increased risk of introducing infection and overall good improvement will defer at this time.  He is on Xarelto and avoiding systemic anti-inflammatories is recommended.  Overall good improvement in the rest of his knee exam.   ++++++++++++++++++++++++++++++++++++++++++++ Orders & Meds: Orders Placed This Encounter  Procedures  . [place] MSK POCT ULTRASOUND    No orders of the defined types were placed in this encounter.   ++++++++++++++++++++++++++++++++++++++++++++ Follow-up: Return in about 6 weeks (around 12/24/2017).   Pertinent documentation may be included in additional procedure notes, imaging studies, problem based documentation and patient instructions. Please see these sections of the encounter for additional information regarding this visit. CMA/ATC served as Education administrator during this visit. History, Physical, and Plan performed by medical provider. Documentation and orders reviewed and attested to.      Gerda Diss, [facility] Physician

## 2017-12-24 ENCOUNTER — Ambulatory Visit: Payer: 59 | Admitting: Sports Medicine

## 2018-01-13 ENCOUNTER — Other Ambulatory Visit: Payer: Self-pay

## 2018-01-13 ENCOUNTER — Ambulatory Visit (HOSPITAL_BASED_OUTPATIENT_CLINIC_OR_DEPARTMENT_OTHER)
Admission: RE | Admit: 2018-01-13 | Discharge: 2018-01-13 | Disposition: A | Discharge status: Home / Self Care | Payer: 59 | Source: Ambulatory Visit | Attending: Hematology & Oncology | Admitting: Hematology & Oncology

## 2018-01-13 ENCOUNTER — Inpatient Hospital Stay: Payer: 59 | Attending: Hematology & Oncology | Admitting: Hematology & Oncology

## 2018-01-13 ENCOUNTER — Inpatient Hospital Stay: Payer: 59

## 2018-01-13 ENCOUNTER — Encounter: Payer: Self-pay | Admitting: Hematology & Oncology

## 2018-01-13 VITALS — BP 130/78 | HR 68 | Temp 97.8°F | Wt 271.0 lb

## 2018-01-13 DIAGNOSIS — I824Z2 Acute embolism and thrombosis of unspecified deep veins of left distal lower extremity: Secondary | ICD-10-CM

## 2018-01-13 DIAGNOSIS — I82412 Acute embolism and thrombosis of left femoral vein: Secondary | ICD-10-CM | POA: Insufficient documentation

## 2018-01-13 DIAGNOSIS — Z7901 Long term (current) use of anticoagulants: Secondary | ICD-10-CM | POA: Diagnosis not present

## 2018-01-13 DIAGNOSIS — I82532 Chronic embolism and thrombosis of left popliteal vein: Secondary | ICD-10-CM

## 2018-01-13 LAB — CBC WITH DIFFERENTIAL (CANCER CENTER ONLY)
BASOS PCT: 0 %
Basophils Absolute: 0 10*3/uL (ref 0.0–0.1)
Eosinophils Absolute: 0.2 10*3/uL (ref 0.0–0.5)
Eosinophils Relative: 4 %
HCT: 43.8 % (ref 38.7–49.9)
HEMOGLOBIN: 15 g/dL (ref 13.0–17.1)
Lymphocytes Relative: 34 %
Lymphs Abs: 1.7 10*3/uL (ref 0.9–3.3)
MCH: 30.9 pg (ref 28.0–33.4)
MCHC: 34.2 g/dL (ref 32.0–35.9)
MCV: 90.1 fL (ref 82.0–98.0)
MONOS PCT: 11 %
Monocytes Absolute: 0.5 10*3/uL (ref 0.1–0.9)
NEUTROS PCT: 51 %
Neutro Abs: 2.5 10*3/uL (ref 1.5–6.5)
Platelet Count: 211 10*3/uL (ref 145–400)
RBC: 4.86 MIL/uL (ref 4.20–5.70)
RDW: 13.8 % (ref 11.1–15.7)
WBC: 4.9 10*3/uL (ref 4.0–10.0)

## 2018-01-13 LAB — CMP (CANCER CENTER ONLY)
ALK PHOS: 110 U/L — AB (ref 26–84)
ALT: 39 U/L (ref 10–47)
AST: 28 U/L (ref 11–38)
Albumin: 4.1 g/dL (ref 3.5–5.0)
Anion gap: 8 (ref 5–15)
BUN: 19 mg/dL (ref 7–22)
CHLORIDE: 108 mmol/L (ref 98–108)
CO2: 28 mmol/L (ref 18–33)
Calcium: 9 mg/dL (ref 8.0–10.3)
Creatinine: 0.9 mg/dL (ref 0.60–1.20)
Glucose, Bld: 111 mg/dL (ref 73–118)
Potassium: 4 mmol/L (ref 3.3–4.7)
Sodium: 144 mmol/L (ref 128–145)
Total Bilirubin: 0.8 mg/dL (ref 0.2–1.6)
Total Protein: 7.3 g/dL (ref 6.4–8.1)

## 2018-01-13 MED ORDER — DOXYCYCLINE HYCLATE 100 MG PO TABS: 100.0000 mg | ORAL_TABLET | Freq: Two times a day (BID) | ORAL | 0 refills | Status: DC

## 2018-01-13 NOTE — Progress Notes (Signed)
Hematology and Oncology Follow Up Visit  Jonathon [name] [id number] 12-Dec-1965 52 y.o. 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     Interim History:  Mr. Neidig is back for a follow-up.  He is doing well.  Unfortunately, an older brother has a glioblastoma.  He lives up in [place].  Thankfully, he is being taken care of at [facility].  He is doing okay.  He does have some neurological issues.  He apparently was diagnosed back in January.  Mr. Greenhalgh had a Doppler done of his left leg today.  The Doppler showed a small, nonocclusive residual thrombus in the left popliteal vein.  This is improved compared to his last Doppler in October 2018.  The good news is that he is going to [place] in 1 week for the Par- 3 tournament at [facility] prior to [employer] we actually made a bet involving a [employer] candy bar.   He has had no problems with traveling.  He does a lot of traveling for his job.  He does wear compression stocking on the left leg.  He has had no cough or shortness of breath.  Has had a infected wound in the umbilicus.  I will give him some doxycycline.  I called this in for him.  He has had no bleeding.  There is been no change in bowel or bladder habits.  Overall, his performance status is ECOG 0.  Medications:  Current Outpatient Medications:  .  allopurinol (ZYLOPRIM) 300 MG tablet, Take 300 mg by mouth every morning. , Disp: , Rfl:  .  Diclofenac Sodium (PENNSAID) 2 % SOLN, Place 1 application onto the skin 2 (two) times daily., Disp: 112 g, Rfl: 2 .  mupirocin ointment (BACTROBAN) 2 %, Apply twice daily as needed to belly button, Disp: 30 g, Rfl: 1 .  rivaroxaban (XARELTO) 10 MG TABS tablet, Take 1 tablet (10 mg total) daily by mouth., Disp: 30  tablet, Rfl: 4  Allergies: No Known Allergies  Past Medical History, Surgical history, Social history, and Family History were reviewed and updated.  Review of Systems: Review of Systems  Constitutional: Negative for appetite change, fatigue, fever and unexpected weight change.  HENT:   Negative for lump/mass, mouth sores, sore throat and trouble swallowing.   Respiratory: Negative for cough, hemoptysis and shortness of breath.   Cardiovascular: Negative for leg swelling and palpitations.  Gastrointestinal: Negative for abdominal distention, abdominal pain, blood in stool, constipation, diarrhea, nausea and vomiting.  Genitourinary: Negative for bladder incontinence, dysuria, frequency and hematuria.   Musculoskeletal: Negative for arthralgias, back pain, gait problem and myalgias.  Skin: Negative for itching and rash.  Neurological: Negative for dizziness, extremity weakness, gait problem, headaches, numbness, seizures and speech difficulty.  Hematological: Does not bruise/bleed easily.  Psychiatric/Behavioral: Negative for depression and sleep disturbance. The patient is not nervous/anxious.     Physical Exam:  weight is 271 lb (122.9 kg). His oral temperature is 97.8 F (36.6 C). His blood pressure is 130/78 and his pulse is 68. His oxygen saturation is 96%.   Wt Readings from Last 3 Encounters:  01/13/18 271 lb (122.9 kg)  11/12/17 283 lb 12.8 oz (128.7 kg)  10/29/17 281 lb 12.8 oz (127.8 kg)  Physical Exam  Constitutional: He is oriented to person, place, and time.  HENT:  Head: Normocephalic and atraumatic.  Mouth/Throat: Oropharynx is clear and moist.  Eyes: Pupils are equal, round, and reactive to light. EOM are normal.  Neck: Normal range of motion.  Cardiovascular: Normal rate, regular rhythm and normal heart sounds.  Pulmonary/Chest: Effort normal and breath sounds normal.  Abdominal: Soft. Bowel sounds are normal.  Musculoskeletal: Normal range of motion. He  exhibits no edema, tenderness or deformity.  Lymphadenopathy:    He has no cervical adenopathy.  Neurological: He is alert and oriented to person, place, and time.  Skin: Skin is warm and dry. No rash noted. No erythema.  Psychiatric: He has a normal mood and affect. His behavior is normal. Judgment and thought content normal.  Vitals reviewed.    Lab Results  Component Value Date   WBC 4.9 01/13/2018   HGB 15.7 07/15/2017   HCT 43.8 01/13/2018   MCV 90.1 01/13/2018   PLT 211 01/13/2018     Chemistry      Component Value Date/Time   NA 144 01/13/2018 0822   NA 141 04/08/2017 1126   K 4.0 01/13/2018 0822   K 3.9 04/08/2017 1126   CL 108 01/13/2018 0822   CL 108 04/08/2017 1126   CO2 28 01/13/2018 0822   CO2 25 04/08/2017 1126   BUN 19 01/13/2018 0822   BUN 21 04/08/2017 1126   CREATININE 0.90 01/13/2018 0822   CREATININE 0.9 04/08/2017 1126      Component Value Date/Time   CALCIUM 9.0 01/13/2018 0822   CALCIUM 9.5 04/08/2017 1126   ALKPHOS 110 (H) 01/13/2018 0822   ALKPHOS 149 (H) 04/08/2017 1126   AST 28 01/13/2018 0822   ALT 39 01/13/2018 0822   ALT 44 04/08/2017 1126   BILITOT 0.8 01/13/2018 0822         Impression and Plan: Mr. Ashmore is a 52 year old white male. He has an idiopathic thrombus in the left leg. This is improving.  We will continue him on the low dose of Xarelto for maintenance.  It is still working.  The Doppler showed that the thrombus is continuing to resolve.  I will see him back in 6 months.  When we see him back, we will do a final Doppler.  If all looks good, then we will switch him over to aspirin.  I will certainly pray hard for his older brother with the glioblastoma.     Volanda Napoleon, MD 4/3/201910:12 AM

## 2018-01-17 ENCOUNTER — Other Ambulatory Visit: Payer: Self-pay | Admitting: Hematology & Oncology

## 2018-05-03 ENCOUNTER — Encounter: Payer: Self-pay | Admitting: Family Medicine

## 2018-05-04 ENCOUNTER — Other Ambulatory Visit: Payer: Self-pay

## 2018-05-04 MED ORDER — ALLOPURINOL 300 MG PO TABS: 300.0000 mg | ORAL_TABLET | ORAL | 0 refills | Status: DC

## 2018-06-02 ENCOUNTER — Other Ambulatory Visit: Payer: Self-pay | Admitting: Family Medicine

## 2018-06-18 ENCOUNTER — Other Ambulatory Visit: Payer: Self-pay | Admitting: Hematology & Oncology

## 2018-07-27 ENCOUNTER — Inpatient Hospital Stay: Payer: 59

## 2018-07-27 ENCOUNTER — Inpatient Hospital Stay: Payer: 59 | Attending: Hematology & Oncology | Admitting: Hematology & Oncology

## 2018-07-27 ENCOUNTER — Other Ambulatory Visit: Payer: Self-pay

## 2018-07-27 VITALS — BP 146/96 | HR 69 | Temp 98.4°F | Resp 18 | Wt 284.0 lb

## 2018-07-27 DIAGNOSIS — I82523 Chronic embolism and thrombosis of iliac vein, bilateral: Secondary | ICD-10-CM

## 2018-07-27 DIAGNOSIS — I824Z2 Acute embolism and thrombosis of unspecified deep veins of left distal lower extremity: Secondary | ICD-10-CM

## 2018-07-27 DIAGNOSIS — I82432 Acute embolism and thrombosis of left popliteal vein: Secondary | ICD-10-CM

## 2018-07-27 DIAGNOSIS — Z7901 Long term (current) use of anticoagulants: Secondary | ICD-10-CM | POA: Diagnosis not present

## 2018-07-27 LAB — CBC WITH DIFFERENTIAL (CANCER CENTER ONLY)
ABS IMMATURE GRANULOCYTES: 0.03 10*3/uL (ref 0.00–0.07)
Basophils Absolute: 0 10*3/uL (ref 0.0–0.1)
Basophils Relative: 1 %
Eosinophils Absolute: 0.3 10*3/uL (ref 0.0–0.5)
Eosinophils Relative: 6 %
HCT: 46.5 % (ref 39.0–52.0)
HEMOGLOBIN: 15.7 g/dL (ref 13.0–17.0)
Immature Granulocytes: 1 %
LYMPHS PCT: 31 %
Lymphs Abs: 1.7 10*3/uL (ref 0.7–4.0)
MCH: 29.7 pg (ref 26.0–34.0)
MCHC: 33.8 g/dL (ref 30.0–36.0)
MCV: 88.1 fL (ref 80.0–100.0)
MONO ABS: 0.6 10*3/uL (ref 0.1–1.0)
MONOS PCT: 10 %
NEUTROS ABS: 2.9 10*3/uL (ref 1.7–7.7)
Neutrophils Relative %: 51 %
PLATELETS: 193 10*3/uL (ref 150–400)
RBC: 5.28 MIL/uL (ref 4.22–5.81)
RDW: 12.6 % (ref 11.5–15.5)
WBC Count: 5.5 10*3/uL (ref 4.0–10.5)
nRBC: 0 % (ref 0.0–0.2)

## 2018-07-27 LAB — CMP (CANCER CENTER ONLY)
ALT: 35 U/L (ref 0–44)
AST: 23 U/L (ref 15–41)
Albumin: 4.1 g/dL (ref 3.5–5.0)
Alkaline Phosphatase: 107 U/L (ref 38–126)
Anion gap: 10 (ref 5–15)
BILIRUBIN TOTAL: 0.7 mg/dL (ref 0.3–1.2)
BUN: 14 mg/dL (ref 6–20)
CHLORIDE: 106 mmol/L (ref 98–111)
CO2: 26 mmol/L (ref 22–32)
Calcium: 9.5 mg/dL (ref 8.9–10.3)
Creatinine: 1.09 mg/dL (ref 0.61–1.24)
GFR, Est AFR Am: >60 mL/min (ref >60)
GFR, Estimated: >60 mL/min (ref >60)
Glucose, Bld: 102 mg/dL — ABNORMAL HIGH (ref 70–99)
POTASSIUM: 4.5 mmol/L (ref 3.5–5.1)
Sodium: 142 mmol/L (ref 135–145)
Total Protein: 7.4 g/dL (ref 6.5–8.1)

## 2018-07-27 NOTE — Progress Notes (Signed)
Hematology and Oncology Follow Up Visit  Jonathon [name] [id number] 01/08/1966 52 y.o. 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  EC ASA 162 mg po q day -- start 07/27/2018      Interim History:  Jonathon [name] is back for a follow-up.  Unfortunately, his brother, up in [place], who had a glioblastoma, passed away in March 21, 2023.  I felt bad for Jonathon [name].  He is helping take care his brother's son who apparently is having some issues.  Jonathon [name] will now finish out the year of Xarelto.  This is maintenance Xarelto.  After this, I will have him take baby aspirin at 162 mg a day.  He has been traveling.  He has had no problems with traveling.  He is little bit upset over his weight.  He is trying to lose some weight.  I can appreciate this.  He is exercising.  Is had no problems with his left leg.  He has had no swelling.  He said no pain.  He has had no issues with shortness of breath.  There is been no chest wall pain.  He had a little bit of a dry cough.  There is been no bleeding.  He has had no change in bowel or bladder habits.   Overall, his performance status is ECOG 0.  Medications:  Current Outpatient Medications:  .  allopurinol (ZYLOPRIM) 300 MG tablet, TAKE 1 TABLET BY MOUTH EVERY DAY IN THE MORNING, Disp: 30 tablet, Rfl: 0 .  mupirocin ointment (BACTROBAN) 2 %, Apply twice daily as needed to belly button, Disp: 30 g, Rfl: 1 .  XARELTO 10 MG TABS tablet, TAKE 1 TABLET (10 MG TOTAL) DAILY BY MOUTH., Disp: 30 tablet, Rfl: 4  Allergies: No Known Allergies  Past Medical History, Surgical history, Social history, and Family History were reviewed and updated.  Review of Systems: Review of Systems  Constitutional: Negative for appetite change, fatigue, fever and unexpected weight  change.  HENT:   Negative for lump/mass, mouth sores, sore throat and trouble swallowing.   Respiratory: Negative for cough, hemoptysis and shortness of breath.   Cardiovascular: Negative for leg swelling and palpitations.  Gastrointestinal: Negative for abdominal distention, abdominal pain, blood in stool, constipation, diarrhea, nausea and vomiting.  Genitourinary: Negative for bladder incontinence, dysuria, frequency and hematuria.   Musculoskeletal: Negative for arthralgias, back pain, gait problem and myalgias.  Skin: Negative for itching and rash.  Neurological: Negative for dizziness, extremity weakness, gait problem, headaches, numbness, seizures and speech difficulty.  Hematological: Does not bruise/bleed easily.  Psychiatric/Behavioral: Negative for depression and sleep disturbance. The patient is not nervous/anxious.     Physical Exam:  weight is 284 lb (128.8 kg). His oral temperature is 98.4 F (36.9 C). His blood pressure is 146/96 (abnormal) and his pulse is 69. His respiration is 18 and oxygen saturation is 95%.   Wt Readings from Last 3 Encounters:  07/27/18 284 lb (128.8 kg)  01/13/18 271 lb (122.9 kg)  11/12/17 283 lb 12.8 oz (128.7 kg)    Physical Exam  Constitutional: He is oriented to person, place, and time.  HENT:  Head: Normocephalic and atraumatic.  Mouth/Throat: Oropharynx is clear and moist.  Eyes: Pupils are equal, round, and reactive to light. EOM are  normal.  Neck: Normal range of motion.  Cardiovascular: Normal rate, regular rhythm and normal heart sounds.  Pulmonary/Chest: Effort normal and breath sounds normal.  Abdominal: Soft. Bowel sounds are normal.  Musculoskeletal: Normal range of motion. He exhibits no edema, tenderness or deformity.  Lymphadenopathy:    He has no cervical adenopathy.  Neurological: He is alert and oriented to person, place, and time.  Skin: Skin is warm and dry. No rash noted. No erythema.  Psychiatric: He has a normal  mood and affect. His behavior is normal. Judgment and thought content normal.  Vitals reviewed.    Lab Results  Component Value Date   WBC 5.5 07/27/2018   HGB 15.7 07/27/2018   HCT 46.5 07/27/2018   MCV 88.1 07/27/2018   PLT 193 07/27/2018     Chemistry      Component Value Date/Time   NA 144 01/13/2018 0822   NA 141 04/08/2017 1126   K 4.0 01/13/2018 0822   K 3.9 04/08/2017 1126   CL 108 01/13/2018 0822   CL 108 04/08/2017 1126   CO2 28 01/13/2018 0822   CO2 25 04/08/2017 1126   BUN 19 01/13/2018 0822   BUN 21 04/08/2017 1126   CREATININE 0.90 01/13/2018 0822   CREATININE 0.9 04/08/2017 1126      Component Value Date/Time   CALCIUM 9.0 01/13/2018 0822   CALCIUM 9.5 04/08/2017 1126   ALKPHOS 110 (H) 01/13/2018 0822   ALKPHOS 149 (H) 04/08/2017 1126   AST 28 01/13/2018 0822   ALT 39 01/13/2018 0822   ALT 44 04/08/2017 1126   BILITOT 0.8 01/13/2018 0822         Impression and Plan: Jonathon [name] is a 52 year old white male. He has an idiopathic thrombus in the left leg. This is improving.  At this point, we will put him on aspirin.  I think this would be very reasonable for him to get on aspirin.  I will let him go from the clinic now.  I do still think we are really added to his medical care at this point.  If he has any problems down the road, I will be more than glad to see him back.  He went our bet.  I paid him up for my loss.  I am a man of my word.  I gave him some M&Ms.       Volanda Napoleon, MD 10/15/20198:44 AM

## 2018-09-01 ENCOUNTER — Ambulatory Visit: Payer: 59 | Admitting: Family Medicine

## 2018-09-01 ENCOUNTER — Encounter: Payer: Self-pay | Admitting: Family Medicine

## 2018-09-01 VITALS — BP 136/88 | HR 66 | Temp 98.6°F | Ht 70.0 in | Wt 282.2 lb

## 2018-09-01 DIAGNOSIS — I10 Essential (primary) hypertension: Secondary | ICD-10-CM

## 2018-09-01 DIAGNOSIS — M1009 Idiopathic gout, multiple sites: Secondary | ICD-10-CM | POA: Diagnosis not present

## 2018-09-01 DIAGNOSIS — Z1211 Encounter for screening for malignant neoplasm of colon: Secondary | ICD-10-CM | POA: Diagnosis not present

## 2018-09-01 DIAGNOSIS — R739 Hyperglycemia, unspecified: Secondary | ICD-10-CM | POA: Diagnosis not present

## 2018-09-01 DIAGNOSIS — I82412 Acute embolism and thrombosis of left femoral vein: Secondary | ICD-10-CM

## 2018-09-01 LAB — LIPID PANEL
CHOLESTEROL: 168 mg/dL (ref 0–200)
HDL: 37.9 mg/dL — AB (ref >39.00)
LDL Cholesterol: 105 mg/dL — ABNORMAL HIGH (ref 0–99)
NONHDL: 130.28
TRIGLYCERIDES: 125 mg/dL (ref 0.0–149.0)
Total CHOL/HDL Ratio: 4
VLDL: 25 mg/dL (ref 0.0–40.0)

## 2018-09-01 LAB — URIC ACID: URIC ACID, SERUM: 9.5 mg/dL — AB (ref 4.0–7.8)

## 2018-09-01 LAB — HEMOGLOBIN A1C: Hgb A1c MFr Bld: 5.5 % (ref 4.6–6.5)

## 2018-09-01 MED ORDER — ALLOPURINOL 300 MG PO TABS: ORAL_TABLET | ORAL | 3 refills | Status: DC

## 2018-09-01 MED ORDER — MUPIROCIN 2 % EX OINT
TOPICAL_OINTMENT | CUTANEOUS | 1 refills | Status: DC
Start: 2018-09-01 — End: 2020-10-29

## 2018-09-01 NOTE — Patient Instructions (Addendum)
We will call you within two weeks about your referral to colonoscopy. If you do not hear within 3 weeks, give [place] a call.   No changes planned today other than you getting back on your weight loss journey with the xarelto out of your way   Please stop by lab before you go

## 2018-09-01 NOTE — Progress Notes (Signed)
Subjective:  Jonathon [name] is a 52 y.o. year old very pleasant male patient who presents for/with See problem oriented charting ROS- mild leg edema left. No chest pain or shortness of breath. No headache or blurry vision.    Past Medical History-  Patient Active Problem List   Diagnosis Date Noted  . Left leg DVT ([place]) 07/30/2016    Priority: High  . History of pulmonary embolism 07/18/2016    Priority: High  . Gout 07/30/2016    Priority: Medium  . OSA (obstructive sleep apnea) 07/30/2016    Priority: Medium  . Essential hypertension 07/30/2016    Priority: Medium  . Traumatic bursitis 11/12/2017    Medications- reviewed and updated Current Outpatient Medications  Medication Sig Dispense Refill  . allopurinol (ZYLOPRIM) 300 MG tablet TAKE 1 TABLET BY MOUTH EVERY DAY IN THE MORNING 90 tablet 3  . mupirocin ointment (BACTROBAN) 2 % Apply twice daily as needed to belly button 30 g 1   No current facility-administered medications for this visit.     Objective: BP 136/88 (BP Location: Left Arm, Patient Position: Sitting, Cuff Size: Large)   Pulse 66   Temp 98.6 F (37 C) (Oral)   Ht 5\' 10"  (1.778 m)   Wt 282 lb 3.2 oz (128 kg)   SpO2 93%   BMI 40.49 kg/m  Gen: NAD, resting comfortably CV: RRR no murmurs rubs or gallops Lungs: CTAB no crackles, wheeze, rhonchi Abdomen: soft/nontender/nondistended/normal bowel sounds.  Ext: trace right edema, none on left Skin: warm, dry  Assessment/Plan: Other notes: 1.  Hyperglycemia-has had some elevated blood sugar on previous labs-fortunately A1c is not elevated-pointing away from increased risk of diabetes.  Should continue to focus on healthy eating and regular exercise 2.  Gets intermittent irritation in bellybutton- mupirocin has helped this with intermittent use-history of infection.  We will refill this today  Essential hypertension S: controlled on no medication- unfortunately weight is up about 10 pounds from April.  He felt bloated on xarelto- felt like set him back as had been losing weight. Doing gym 4-5 days a week. Feels like he can get back on dietary changes now- has more energy off xarelto. He feels like he needs to cut down on portion control.  BP Readings from Last 3 Encounters:  09/01/18 136/88  07/27/18 (!) 146/96  01/13/18 130/78  A/P: We discussed blood pressure goal of <140/90. Continue without medication but we did discuss importance of healthy eating and regular exercise to help bring this down further  Gout S: 0 flares in 6 months on allopurinol 300mg -he admits to intermittent use of allopurinol and trying to "stretch it out" Lab Results  Component Value Date   LABURIC 9.5 (H) 09/01/2018  A/P: Uric acid is very elevated-would encourage patient to take his allopurinol regularly.   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 Marin Olp who released him.  History of prior pulmonary embolism-no evidence of this without shortness of breath.  Using thigh high compression stockings with travel - plane or over 2 hours driving. No swelling in legs since coming off xarelto and going onto aspirin.  A/P: Stable-has been released from oncology-continue aspirin long-term.  Continue compression stockings with travel over 2 hours   No future appointments. No follow-ups on file.  Lab/Order associations: Screen for colon cancer - Plan: Ambulatory referral to Gastroenterology  Idiopathic gout of multiple sites,  unspecified chronicity - Plan: Uric acid  Essential hypertension - Plan: Lipid panel  Hyperglycemia - Plan: Hemoglobin A1c  Deep vein thrombosis (DVT) of femoral vein of left lower extremity, unspecified chronicity (HCC)  Meds ordered this encounter  Medications  . allopurinol (ZYLOPRIM) 300 MG tablet    Sig: TAKE 1 TABLET BY MOUTH EVERY DAY IN THE MORNING    Dispense:  90 tablet    Refill:  3   . mupirocin ointment (BACTROBAN) 2 %    Sig: Apply twice daily as needed to belly button    Dispense:  30 g    Refill:  1   Return precautions advised.  Garret Reddish, MD

## 2018-09-01 NOTE — Assessment & Plan Note (Signed)
S: controlled on no medication- unfortunately weight is up about 10 pounds from April. He felt bloated on xarelto- felt like set him back as had been losing weight. Doing gym 4-5 days a week. Feels like he can get back on dietary changes now- has more energy off xarelto. He feels like he needs to cut down on portion control.  BP Readings from Last 3 Encounters:  09/01/18 136/88  07/27/18 (!) 146/96  01/13/18 130/78  A/P: We discussed blood pressure goal of <140/90. Continue without medication but we did discuss importance of healthy eating and regular exercise to help bring this down further

## 2018-09-01 NOTE — Assessment & Plan Note (Signed)
S: 0 flares in 6 months on allopurinol 300mg -he admits to intermittent use of allopurinol and trying to "stretch it out" Lab Results  Component Value Date   LABURIC 9.5 (H) 09/01/2018  A/P: Uric acid is very elevated-would encourage patient to take his allopurinol regularly.

## 2018-09-01 NOTE — Assessment & Plan Note (Signed)
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 Marin Olp who released him.  History of prior pulmonary embolism-no evidence of this without shortness of breath.  Using thigh high compression stockings with travel - plane or over 2 hours driving. No swelling in legs since coming off xarelto and going onto aspirin.  A/P: Stable-has been released from oncology-continue aspirin long-term.  Continue compression stockings with travel over 2 hours

## 2018-09-20 ENCOUNTER — Encounter: Payer: Self-pay | Admitting: Gastroenterology

## 2018-10-14 ENCOUNTER — Ambulatory Visit (AMBULATORY_SURGERY_CENTER): Payer: Self-pay | Admitting: *Deleted

## 2018-10-14 VITALS — Ht 70.0 in | Wt 291.0 lb

## 2018-10-14 DIAGNOSIS — Z1211 Encounter for screening for malignant neoplasm of colon: Secondary | ICD-10-CM

## 2018-10-14 MED ORDER — NA SULFATE-K SULFATE-MG SULF 17.5-3.13-1.6 GM/177ML PO SOLN: 1.0000 | Freq: Once | ORAL | 0 refills | Status: AC

## 2018-10-14 NOTE — Progress Notes (Signed)
No egg or soy allergy known to patient  No issues with past sedation with any surgeries  or procedures, no intubation problems  No diet pills per patient No home 02 use per patient  No blood thinners per patient  Pt denies issues with constipation  No A fib or A flutter  EMMI video sent to pt's e mail -- pt declined  Suprep $15 coupon

## 2018-10-15 ENCOUNTER — Encounter: Payer: 59 | Admitting: Gastroenterology

## 2018-10-29 ENCOUNTER — Encounter: Payer: Self-pay | Admitting: Gastroenterology

## 2018-11-12 ENCOUNTER — Encounter: Payer: Self-pay | Admitting: Gastroenterology

## 2018-11-12 ENCOUNTER — Ambulatory Visit (AMBULATORY_SURGERY_CENTER): Payer: 59 | Admitting: Gastroenterology

## 2018-11-12 VITALS — BP 125/80 | HR 74 | Temp 98.6°F | Resp 12 | Ht 70.0 in | Wt 291.0 lb

## 2018-11-12 DIAGNOSIS — D120 Benign neoplasm of cecum: Secondary | ICD-10-CM

## 2018-11-12 DIAGNOSIS — D128 Benign neoplasm of rectum: Secondary | ICD-10-CM | POA: Diagnosis not present

## 2018-11-12 DIAGNOSIS — K621 Rectal polyp: Secondary | ICD-10-CM | POA: Diagnosis not present

## 2018-11-12 DIAGNOSIS — D123 Benign neoplasm of transverse colon: Secondary | ICD-10-CM

## 2018-11-12 DIAGNOSIS — D122 Benign neoplasm of ascending colon: Secondary | ICD-10-CM

## 2018-11-12 DIAGNOSIS — D125 Benign neoplasm of sigmoid colon: Secondary | ICD-10-CM

## 2018-11-12 DIAGNOSIS — Z1211 Encounter for screening for malignant neoplasm of colon: Secondary | ICD-10-CM | POA: Diagnosis present

## 2018-11-12 DIAGNOSIS — D124 Benign neoplasm of descending colon: Secondary | ICD-10-CM

## 2018-11-12 DIAGNOSIS — D129 Benign neoplasm of anus and anal canal: Secondary | ICD-10-CM

## 2018-11-12 MED ORDER — SODIUM CHLORIDE 0.9 % IV SOLN: 500.0000 mL | Freq: Once | INTRAVENOUS | Status: DC

## 2018-11-12 NOTE — Patient Instructions (Signed)
Impression/Recommendations:  Polyp handout given to patient.  Resume previous diet. Continue present medications.  Await pathology results.  YOU HAD AN ENDOSCOPIC PROCEDURE TODAY AT [facility]:   Refer to the procedure report that was given to you for any specific questions about what was found during the examination.  If the procedure report does not answer your questions, please call your gastroenterologist to clarify.  If you requested that your care partner not be given the details of your procedure findings, then the procedure report has been included in a sealed envelope for you to review at your convenience later.  YOU SHOULD EXPECT: Some feelings of bloating in the abdomen. Passage of more gas than usual.  Walking can help get rid of the air that was put into your GI tract during the procedure and reduce the bloating. If you had a lower endoscopy (such as a colonoscopy or flexible sigmoidoscopy) you may notice spotting of blood in your stool or on the toilet paper. If you underwent a bowel prep for your procedure, you may not have a normal bowel movement for a few days.  Please Note:  You might notice some irritation and congestion in your nose or some drainage.  This is from the oxygen used during your procedure.  There is no need for concern and it should clear up in a day or so.  SYMPTOMS TO REPORT IMMEDIATELY:   Following lower endoscopy (colonoscopy or flexible sigmoidoscopy):  Excessive amounts of blood in the stool  Significant tenderness or worsening of abdominal pains  Swelling of the abdomen that is new, acute  Fever of 100F or higher  For urgent or emergent issues, a gastroenterologist can be reached at any hour by calling [phone].   DIET:  We do recommend a small meal at first, but then you may proceed to your regular diet.  Drink plenty of fluids but you should avoid alcoholic beverages for 24 hours.  ACTIVITY:  You should plan to take it  easy for the rest of today and you should NOT DRIVE or use heavy machinery until tomorrow (because of the sedation medicines used during the test).    FOLLOW UP: Our staff will call the number listed on your records the next business day following your procedure to check on you and address any questions or concerns that you may have regarding the information given to you following your procedure. If we do not reach you, we will leave a message.  However, if you are feeling well and you are not experiencing any problems, there is no need to return our call.  We will assume that you have returned to your regular daily activities without incident.  If any biopsies were taken you will be contacted by phone or by letter within the next 1-3 weeks.  Please call us at [phone] if you have not heard about the biopsies in 3 weeks.    SIGNATURES/CONFIDENTIALITY: You and/or your care partner have signed paperwork which will be entered into your electronic medical record.  These signatures attest to the fact that that the information above on your After Visit Summary has been reviewed and is understood.  Full responsibility of the confidentiality of this discharge information lies with you and/or your care-partner.

## 2018-11-12 NOTE — Progress Notes (Signed)
Called to room to assist during endoscopic procedure.  Patient ID and intended procedure confirmed with present staff. Received instructions for my participation in the procedure from the performing physician.  

## 2018-11-12 NOTE — Progress Notes (Signed)
Report to PACU, RN, vss, BBS= Clear.  

## 2018-11-12 NOTE — Op Note (Signed)
[facility] Patient Name: Jonathon [name] Procedure Date: 11/12/2018 9:16 AM MRN: [id number] Endoscopist: Remo Lipps P. Havery Moros , MD Age: 52 Referring MD:  Date of Birth: [birth year] Gender: Male Account #: [id number] Procedure:                Colonoscopy Indications:              Screening for colorectal malignant neoplasm, This                            is the patient's first colonoscopy Medicines:                Monitored Anesthesia Care Procedure:                Pre-Anesthesia Assessment:                           - Prior to the procedure, a History and Physical                            was performed, and patient medications and                            allergies were reviewed. The patient's tolerance of                            previous anesthesia was also reviewed. The risks                            and benefits of the procedure and the sedation                            options and risks were discussed with the patient.                            All questions were answered, and informed consent                            was obtained. Prior Anticoagulants: The patient has                            taken no previous anticoagulant or antiplatelet                            agents. ASA Grade Assessment: III - A patient with                            severe systemic disease. After reviewing the risks                            and benefits, the patient was deemed in                            satisfactory condition to undergo the procedure.  After obtaining informed consent, the colonoscope                            was passed under direct vision. Throughout the                            procedure, the patient's blood pressure, pulse, and                            oxygen saturations were monitored continuously. The                            Colonoscope was introduced through the anus and                            advanced to the  the cecum, identified by                            appendiceal orifice and ileocecal valve. The                            colonoscopy was performed without difficulty. The                            patient tolerated the procedure well. The quality                            of the bowel preparation was good. The ileocecal                            valve, appendiceal orifice, and rectum were                            photographed. Scope In: 9:21:08 AM Scope Out: 9:50:07 AM Scope Withdrawal Time: 0 hours 21 minutes 17 seconds  Total Procedure Duration: 0 hours 28 minutes 59 seconds  Findings:                 The perianal and digital rectal examinations were                            normal.                           A single small angiodysplastic lesion was found in                            the cecum.                           A 3 mm polyp was found in the cecum. The polyp was                            sessile. The polyp was removed with a cold snare.  Resection and retrieval were complete.                           A 3 mm polyp was found in the ascending colon. The                            polyp was sessile. The polyp was removed with a                            cold snare. Resection and retrieval were complete.                           A 3 mm polyp was found in the transverse colon. The                            polyp was sessile. The polyp was removed with a                            cold snare. Resection and retrieval were complete.                           A 4 mm polyp was found in the descending colon. The                            polyp was sessile. The polyp was removed with a                            cold snare. Resection and retrieval were complete.                           Two sessile polyps were found in the sigmoid colon.                            The polyps were 3 to 4 mm in size. These polyps                            were  removed with a cold snare. Resection and                            retrieval were complete.                           A 7 mm polyp was found in the sigmoid colon. The                            polyp was semi-pedunculated. The polyp was removed                            with a hot snare. Resection and retrieval were                            complete.  A 7 mm polyp was found in the recto-sigmoid colon.                            The polyp was semi-pedunculated. The polyp was                            removed with a hot snare. Resection and retrieval                            were complete.                           A hypertrophied anal canal polyp versus polypoid                            lesion was found in the anus. Biopsies were taken                            with a cold forceps for histology.                           The exam was otherwise without abnormality. Complications:            No immediate complications. Estimated blood loss:                            Minimal. Estimated Blood Loss:     Estimated blood loss was minimal. Impression:               - A single colonic angiodysplastic lesion.                           - One 3 mm polyp in the cecum, removed with a cold                            snare. Resected and retrieved.                           - One 3 mm polyp in the ascending colon, removed                            with a cold snare. Resected and retrieved.                           - One 3 mm polyp in the transverse colon, removed                            with a cold snare. Resected and retrieved.                           - One 4 mm polyp in the descending colon, removed                            with a cold snare. Resected and retrieved.                           -  Two 3 to 4 mm polyps in the sigmoid colon,                            removed with a cold snare. Resected and retrieved.                           - One 7 mm polyp in  the sigmoid colon, removed with                            a hot snare. Resected and retrieved.                           - One 7 mm polyp at the recto-sigmoid colon,                            removed with a hot snare. Resected and retrieved.                           - One diminutive polypoid lesion at the anal canal.                            Biopsied.                           - The examination was otherwise normal. Recommendation:           - Patient has a contact number available for                            emergencies. The signs and symptoms of potential                            delayed complications were discussed with the                            patient. Return to normal activities tomorrow.                            Written discharge instructions were provided to the                            patient.                           - Resume previous diet.                           - Continue present medications.                           - Await pathology results. Remo Lipps P. Armbruster, MD 11/12/2018 9:58:31 AM This report has been signed electronically.

## 2018-11-15 ENCOUNTER — Telehealth: Payer: Self-pay | Admitting: *Deleted

## 2018-11-15 NOTE — Telephone Encounter (Signed)
  Follow up Call-  Call back number 11/12/2018  Post procedure Call Back phone  # [phone]  Permission to leave phone message Yes  Some recent data might be hidden     Patient questions:  Left message to call us if necessary.

## 2018-11-15 NOTE — Telephone Encounter (Signed)
  Follow up Call-  Call back number 11/12/2018  Post procedure Call Back phone  # [phone]  Permission to leave phone message Yes  Some recent data might be hidden     Patient questions:  Do you have a fever, pain , or abdominal swelling? No. Pain Score  0 *  Have you tolerated food without any problems? Yes.    Have you been able to return to your normal activities? Yes.    Do you have any questions about your discharge instructions: Diet   No. Medications  No. Follow up visit  No.  Do you have questions or concerns about your Care? No.  Actions: * If pain score is 4 or above: No action needed, pain <4.

## 2019-08-25 ENCOUNTER — Other Ambulatory Visit: Payer: Self-pay

## 2019-08-25 ENCOUNTER — Emergency Department (HOSPITAL_BASED_OUTPATIENT_CLINIC_OR_DEPARTMENT_OTHER)
Admission: EM | Admit: 2019-08-25 | Discharge: 2019-08-25 | Disposition: A | Discharge status: Home / Self Care | Payer: BC Managed Care – PPO | Attending: Emergency Medicine | Admitting: Emergency Medicine

## 2019-08-25 ENCOUNTER — Emergency Department (HOSPITAL_BASED_OUTPATIENT_CLINIC_OR_DEPARTMENT_OTHER): Payer: BC Managed Care – PPO

## 2019-08-25 ENCOUNTER — Encounter (HOSPITAL_BASED_OUTPATIENT_CLINIC_OR_DEPARTMENT_OTHER): Payer: Self-pay | Admitting: *Deleted

## 2019-08-25 ENCOUNTER — Ambulatory Visit: Payer: Self-pay

## 2019-08-25 DIAGNOSIS — F419 Anxiety disorder, unspecified: Secondary | ICD-10-CM | POA: Diagnosis not present

## 2019-08-25 DIAGNOSIS — R2242 Localized swelling, mass and lump, left lower limb: Secondary | ICD-10-CM | POA: Insufficient documentation

## 2019-08-25 DIAGNOSIS — I10 Essential (primary) hypertension: Secondary | ICD-10-CM | POA: Diagnosis not present

## 2019-08-25 DIAGNOSIS — M7989 Other specified soft tissue disorders: Secondary | ICD-10-CM | POA: Diagnosis not present

## 2019-08-25 DIAGNOSIS — R0789 Other chest pain: Secondary | ICD-10-CM

## 2019-08-25 DIAGNOSIS — Z7982 Long term (current) use of aspirin: Secondary | ICD-10-CM | POA: Insufficient documentation

## 2019-08-25 DIAGNOSIS — R030 Elevated blood-pressure reading, without diagnosis of hypertension: Secondary | ICD-10-CM

## 2019-08-25 DIAGNOSIS — R0602 Shortness of breath: Secondary | ICD-10-CM | POA: Diagnosis not present

## 2019-08-25 DIAGNOSIS — Z79899 Other long term (current) drug therapy: Secondary | ICD-10-CM | POA: Diagnosis not present

## 2019-08-25 DIAGNOSIS — M79604 Pain in right leg: Secondary | ICD-10-CM | POA: Diagnosis not present

## 2019-08-25 DIAGNOSIS — M79605 Pain in left leg: Secondary | ICD-10-CM | POA: Diagnosis not present

## 2019-08-25 LAB — BASIC METABOLIC PANEL
Anion gap: 6 (ref 5–15)
BUN: 18 mg/dL (ref 6–20)
CO2: 25 mmol/L (ref 22–32)
Calcium: 9.1 mg/dL (ref 8.9–10.3)
Chloride: 106 mmol/L (ref 98–111)
Creatinine, Ser: 1.04 mg/dL (ref 0.61–1.24)
GFR calc Af Amer: >60 mL/min (ref >60)
GFR calc non Af Amer: >60 mL/min (ref >60)
Glucose, Bld: 82 mg/dL (ref 70–99)
Potassium: 3.7 mmol/L (ref 3.5–5.1)
Sodium: 137 mmol/L (ref 135–145)

## 2019-08-25 LAB — CBC
HCT: 46.8 % (ref 39.0–52.0)
Hemoglobin: 16 g/dL (ref 13.0–17.0)
MCH: 30.7 pg (ref 26.0–34.0)
MCHC: 34.2 g/dL (ref 30.0–36.0)
MCV: 89.7 fL (ref 80.0–100.0)
Platelets: 198 10*3/uL (ref 150–400)
RBC: 5.22 MIL/uL (ref 4.22–5.81)
RDW: 13 % (ref 11.5–15.5)
WBC: 6.3 10*3/uL (ref 4.0–10.5)
nRBC: 0 % (ref 0.0–0.2)

## 2019-08-25 LAB — TROPONIN I (HIGH SENSITIVITY)
Troponin I (High Sensitivity): 6 ng/L (ref <18)
Troponin I (High Sensitivity): 7 ng/L (ref <18)

## 2019-08-25 MED ORDER — IOHEXOL 350 MG/ML SOLN
100.0000 mL | Freq: Once | INTRAVENOUS | Status: AC | PRN
  Administered 2019-08-25: 100 mL via INTRAVENOUS

## 2019-08-25 MED ORDER — SODIUM CHLORIDE 0.9 % IV BOLUS
500.0000 mL | Freq: Once | INTRAVENOUS | Status: AC
  Administered 2019-08-25: 500 mL via INTRAVENOUS

## 2019-08-25 MED ORDER — LORAZEPAM 2 MG/ML IJ SOLN: 0.5000 mg | Freq: Once | INTRAMUSCULAR | Status: DC

## 2019-08-25 NOTE — Telephone Encounter (Signed)
See note

## 2019-08-25 NOTE — Discharge Instructions (Addendum)
You have been diagnosed today with Atypical Chest Pain and Elevated Blood pressure reading.  At this time there does not appear to be the presence of an emergent medical condition, however there is always the potential for conditions to change. Please read and follow the below instructions.  Please return to the Emergency Department immediately for any new or worsening symptoms. Please be sure to follow up with your Primary Care Provider within one week regarding your visit today; please call their office to schedule an appointment even if you are feeling better for a follow-up visit. Continue to take all your home medications as prescribed by your primary care provider and Dr. Marin Olp. Your blood pressure was elevated today, please call your primary care doctor's office today to schedule a follow-up appointment within 1 week for blood pressure recheck and potential medication management. Your CT scan today showed a small thyroid nodule without change from 2017.  Please discuss this incidental finding with your primary care provider at your next visit.  Get help right away if: You have a cough that gets worse, or you cough up blood. You have very bad (severe) pain in your belly (abdomen). You pass out (faint). You have either of these for no clear reason: Sudden chest discomfort. Sudden discomfort in your arms, back, neck, or jaw. You have shortness of breath at any time. You suddenly start to sweat, or your skin gets clammy. You feel sick to your stomach (nauseous). You throw up (vomit). You suddenly feel lightheaded or dizzy. You feel very weak or tired. Your heart starts to beat fast, or it feels like it is skipping beats. Get a very bad headache. Start to feel mixed up (confused). Feel weak or numb. Feel faint. Have very bad pain in your: Chest. Belly (abdomen). Throw up more than once. Have trouble breathing. You have any new/concerning or worsening of symptoms  Please read  the additional information packets attached to your discharge summary.  Do not take your medicine if  develop an itchy rash, swelling in your mouth or lips, or difficulty breathing; call 911 and seek immediate emergency medical attention if this occurs.  Note: Portions of this text may have been transcribed using voice recognition software. Every effort was made to ensure accuracy; however, inadvertent computerized transcription errors may still be present.

## 2019-08-25 NOTE — ED Notes (Signed)
Provider at bedside

## 2019-08-25 NOTE — ED Triage Notes (Signed)
Swelling in both of his lower legs x 3 days. 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 He stopped blood thinner a year ago. He has been taking left over Xarelto x 2 days.

## 2019-08-25 NOTE — Telephone Encounter (Signed)
See below

## 2019-08-25 NOTE — ED Provider Notes (Signed)
[facility] EMERGENCY DEPARTMENT Provider Note   CSN: [id number] Arrival date & time: 08/25/19  1046     History   Chief Complaint Chief Complaint  Patient presents with   Leg Swelling    HPI Jonathon [name] is a 53 y.o. male with chief complaint of left leg swelling x3 days.  On my evaluation patient reports left leg swelling x3 days reports similar in the past when he had a DVT of the same leg.  He reports that over the past 2 days he has been using leftover Xarelto, patient reports being off of Xarelto for 1 year.  Patient reports that he had a DVT/PE in 2017, he was on Xarelto for 2 years before being discontinued by Dr. Marin Olp and switch to 81 mg aspirin daily which patient reports compliance.  On review of symptoms patient does endorse shortness of breath reports that it has been ongoing for 2-3 days, describes a dull pain in the center of his chest causes him to feel short of breath nonradiating mild constant without aggravating or alleviating factors.  Of note patient reports increased feeling of anxiety since his niece passed away from a myocardial infarction 1 week ago at the age of 65.  Patient denies history of heart disease, hypertension, hyperlipidemia, diabetes, smoking or CVA/TIA.  Of note patient reports that his father had an MI at the age of 64.  Patient denies fever/chills, headache, neck pain, cough/hemoptysis, abdominal pain, nausea/vomiting, diarrhea, diaphoresis, extremity color change, worsening of symptoms with exertion or any additional concerns.    HPI  Past Medical History:  Diagnosis Date   Chicken pox    Clotting disorder ([place])    DVT left leg, PE 2017   Gout    Left leg DVT ([place]) 07/30/2016   Pulmonary embolism ([place]) 07/18/2016    Patient Active Problem List   Diagnosis Date Noted   Traumatic bursitis 11/12/2017   Left leg DVT ([place]) 07/30/2016   Gout 07/30/2016   OSA (obstructive sleep apnea) 07/30/2016    Essential hypertension 07/30/2016   History of pulmonary embolism 07/18/2016    Past Surgical History:  Procedure Laterality Date   APPENDECTOMY  2003   CHOLECYSTECTOMY  2002   HERNIA REPAIR     as child- unsure location   LASIK     similar to but not lasik per patient   NASAL SEPTUM [place] Medications    Prior to Admission medications   Medication Sig Start Date End Date Taking? Authorizing Provider  allopurinol (ZYLOPRIM) 300 MG tablet TAKE 1 TABLET BY MOUTH EVERY DAY IN THE MORNING 09/01/18  Yes Marin Olp, MD  aspirin 81 MG chewable tablet Chew by mouth daily.   Yes [provider]  mupirocin ointment (BACTROBAN) 2 % Apply twice daily as needed to belly button 09/01/18  Yes Marin Olp, MD    Family History Family History  Problem Relation Age of Onset   Heart attack Father 8   Hypercalcemia Father    Diabetes Father    Hypertension Father    Uterine cancer Mother    Stroke Paternal Uncle    Alcohol abuse Paternal Grandfather    Other Sister        51 years older   Brain cancer Brother        glioblastoma- 77 years older   Other Brother  15 years older   Colon cancer Neg Hx    Colon polyps Neg Hx    Esophageal cancer Neg Hx    Rectal cancer Neg Hx    Stomach cancer Neg Hx     Social History Social History   Tobacco Use   Smoking status: Never Smoker   Smokeless tobacco: Never Used  Substance Use Topics   Alcohol use: Yes    Alcohol/week: 5.0 - 6.0 standard drinks    Types: 5 - 6 Cans of beer per week    Comment: occasional heavy   Drug use: No     Allergies   Patient has no known allergies.   Review of Systems Review of Systems Ten systems are reviewed and are negative for acute change except as noted in the HPI  Physical Exam Updated Vital Signs BP (!) 145/100    Pulse 87    Temp 98.8 F (37.1 C) (Oral)    Resp 14    Ht 5\' 10"  (1.778 m)     Wt 122.5 kg    SpO2 96%    BMI 38.74 kg/m   Physical Exam Constitutional:      General: He is not in acute distress.    Appearance: Normal appearance. He is well-developed. He is obese. He is not ill-appearing or diaphoretic.  HENT:     Head: Normocephalic and atraumatic.     Right Ear: External ear normal.     Left Ear: External ear normal.     Nose: Nose normal.  Eyes:     General: Vision grossly intact. Gaze aligned appropriately.     Pupils: Pupils are equal, round, and reactive to light.  Neck:     Musculoskeletal: Normal range of motion.     Trachea: Trachea and phonation normal. No tracheal deviation.  Cardiovascular:     Rate and Rhythm: Normal rate and regular rhythm.     Pulses: Normal pulses.          Dorsalis pedis pulses are 2+ on the right side and 2+ on the left side.     Heart sounds: Normal heart sounds.  Pulmonary:     Effort: Pulmonary effort is normal. No respiratory distress.     Breath sounds: Normal breath sounds.  Abdominal:     General: There is no distension.     Palpations: Abdomen is soft.     Tenderness: There is no abdominal tenderness. There is no guarding or rebound.  Musculoskeletal: Normal range of motion.     Right lower leg: Normal.     Left lower leg: Normal.     Comments: No sign of significant swelling or color difference between legs.  Feet:     Right foot:     Protective Sensation: 3 sites tested. 3 sites sensed.     Left foot:     Protective Sensation: 3 sites tested. 3 sites sensed.  Skin:    General: Skin is warm and dry.  Neurological:     Mental Status: He is alert.     GCS: GCS eye subscore is 4. GCS verbal subscore is 5. GCS motor subscore is 6.     Comments: Speech is clear and goal oriented, follows commands Major Cranial nerves without deficit, no facial droop Moves extremities without ataxia, coordination intact  Psychiatric:        Behavior: Behavior normal.      ED Treatments / Results  Labs (all labs  ordered are listed, but only abnormal results  are displayed) Labs Reviewed  BASIC METABOLIC PANEL  CBC  TROPONIN I (HIGH SENSITIVITY)  TROPONIN I (HIGH SENSITIVITY)    EKG EKG Interpretation  Date/Time:  Thursday August 25 2019 12:19:49 EST Ventricular Rate:  89 PR Interval:    QRS Duration: 96 QT Interval:  377 QTC Calculation: 459 R Axis:   135 Text Interpretation: Sinus rhythm Left posterior fascicular block No significant change since last tracing Confirmed by Blanchie Dessert [phone]) on 08/25/2019 12:45:35 PM   Radiology Ct Angio Chest Pe W And/or Wo Contrast  Result Date: 08/25/2019 CLINICAL DATA:  Bilateral leg swelling for 3 days and shortness of breath. EXAM: CT ANGIOGRAPHY CHEST WITH CONTRAST TECHNIQUE: Multidetector CT imaging of the chest was performed using the standard protocol during bolus administration of intravenous contrast. Multiplanar CT image reconstructions and MIPs were obtained to evaluate the vascular anatomy. CONTRAST:  133mL OMNIPAQUE IOHEXOL 350 MG/ML SOLN, 152mL OMNIPAQUE IOHEXOL 350 MG/ML SOLN COMPARISON:  07/19/2016 FINDINGS: Cardiovascular: The heart is normal in size. No pericardial effusion. The aorta is normal in caliber. No dissection. No atherosclerotic calcifications. The branch vessels are patent. No definite coronary artery calcifications. The pulmonary arterial tree is fairly well opacified. No filling defects to suggest pulmonary embolism. Mediastinum/Nodes: No mediastinal or hilar mass or adenopathy. Small scattered lymph nodes are stable. There is a small nodule in the superior mediastinum near the lower medial border of the left thyroid lobe which could be a small thyroid nodule. No change since 2017 consistent with a benign process. Lungs/Pleura: The lungs are clear. No infiltrates, edema, effusions or pulmonary lesions. No bronchiectasis or interstitial lung disease. Upper Abdomen: No significant upper abdominal findings. Musculoskeletal: No  chest wall mass, supraclavicular or axillary adenopathy. Review of the MIP images confirms the above findings. IMPRESSION: 1. No CT findings for pulmonary embolism. 2. Normal thoracic aorta. 3. No acute pulmonary findings. Electronically Signed   By: Marijo Sanes M.D.   On: 08/25/2019 13:49   US Venous Img Lower Bilateral  Result Date: 08/25/2019 CLINICAL DATA:  Lower extremity pain and swelling for 4 days. EXAM: BILATERAL LOWER EXTREMITY VENOUS DOPPLER ULTRASOUND TECHNIQUE: Gray-scale sonography with graded compression, as well as color Doppler and duplex ultrasound were performed to evaluate the lower extremity deep venous systems from the level of the common femoral vein and including the common femoral, femoral, profunda femoral, popliteal and calf veins including the posterior tibial, peroneal and gastrocnemius veins when visible. The superficial great saphenous vein was also interrogated. Spectral Doppler was utilized to evaluate flow at rest and with distal augmentation maneuvers in the common femoral, femoral and popliteal veins. COMPARISON:  01/13/2018 FINDINGS: RIGHT LOWER EXTREMITY Common Femoral Vein: No evidence of thrombus. Normal compressibility, respiratory phasicity and response to augmentation. Saphenofemoral Junction: No evidence of thrombus. Normal compressibility and flow on color Doppler imaging. Profunda Femoral Vein: No evidence of thrombus. Normal compressibility and flow on color Doppler imaging. Femoral Vein: No evidence of thrombus. Normal compressibility, respiratory phasicity and response to augmentation. Popliteal Vein: No evidence of thrombus. Normal compressibility, respiratory phasicity and response to augmentation. Calf Veins: No evidence of thrombus. Normal compressibility and flow on color Doppler imaging. Superficial Great Saphenous Vein: No evidence of thrombus. Normal compressibility. Venous Reflux:  None. Other Findings:  None. LEFT LOWER EXTREMITY Common Femoral Vein:  No evidence of thrombus. Normal compressibility, respiratory phasicity and response to augmentation. Saphenofemoral Junction: No evidence of thrombus. Normal compressibility and flow on color Doppler imaging. Profunda Femoral Vein: No evidence of thrombus.  Normal compressibility and flow on color Doppler imaging. Femoral Vein: No evidence of thrombus. Normal compressibility, respiratory phasicity and response to augmentation. Popliteal Vein: No evidence of thrombus. Normal compressibility, respiratory phasicity and response to augmentation. Calf Veins: No evidence of thrombus. Normal compressibility and flow on color Doppler imaging. Superficial Great Saphenous Vein: No evidence of thrombus. Normal compressibility. Venous Reflux:  None. Other Findings:  None. IMPRESSION: No evidence of deep venous thrombosis in either lower extremity. Electronically Signed   By: Markus Daft M.D.   On: 08/25/2019 12:23   Dg Chest Port 1 View  Result Date: 08/25/2019 CLINICAL DATA:  53 year old male with shortness of breath. EXAM: PORTABLE CHEST 1 VIEW COMPARISON:  Chest CT dated 09/18/2016. FINDINGS: The heart size and mediastinal contours are within normal limits. Both lungs are clear. The visualized skeletal structures are unremarkable. IMPRESSION: No active disease. Electronically Signed   By: Anner Crete M.D.   On: 08/25/2019 12:22    Procedures Procedures (including critical care time)  Medications Ordered in ED Medications  iohexol (OMNIPAQUE) 350 MG/ML injection 100 mL (100 mLs Intravenous Contrast Given 08/25/19 1259)  iohexol (OMNIPAQUE) 350 MG/ML injection 100 mL (100 mLs Intravenous Contrast Given 08/25/19 1319)  sodium chloride 0.9 % bolus 500 mL (500 mLs Intravenous New Bag/Given 08/25/19 1451)     Initial Impression / Assessment and Plan / ED Course  I have reviewed the triage vital signs and the nursing notes.  Pertinent labs & imaging results that were available during my care of the patient  were reviewed by me and considered in my medical decision making (see chart for details).     On initial valuation patient resting comfortably no acute distress.  Heart regular rate and rhythm without murmur, lungs clear to auscultation bilaterally, abdomen soft nontender without peritoneal signs, neurovascular intact to bilateral lower extremities without evidence of DVT.  Additionally no sign of cellulitis, septic arthritis, compartment syndrome or injury. - EKG: Sinus rhythm Left posterior fascicular block No significant change since last tracing Confirmed by Blanchie Dessert [phone]) on 08/25/2019 12:45:35 PM  CBC within normal limits BMP within normal limits High-sensitivity troponin: 6 Delta HS troponin: 7 Chest x-ray:    IMPRESSION:  No active disease.   US Venous Lower Bilateral:  IMPRESSION:  No evidence of deep venous thrombosis in either lower extremity.   CT angio PE:  IMPRESSION:  1. No CT findings for pulmonary embolism.  2. Normal thoracic aorta.  3. No acute pulmonary findings.  ---- Patient reassessed resting comfortably no acute distress reports improvement of symptoms.  He states understanding of all results today and reports this is helped with his anxiety.  Chest pain sounds very atypical today, may be secondary to anxiety due to increased stress of recent family passing.  Heart score less than 4.  Patient was noted to be hypertensive today, asymptomatic regarding hypertension advised for him to follow-up with PCP this week for blood pressure recheck and possible medication management, advised of signs/symptoms of hypertensive urgency/emergency and to return to emergency department they occur.  Hypertension has been resolving since patient has become less anxious. Pt has been advised to return to the ED is CP becomes exertional, associated with diaphoresis or nausea, radiates to left jaw/arm, worsens or becomes concerning in any way. Pt appears reliable for follow up  and is agreeable to discharge.  Patient advised on incidental thyroid nodule and to follow-up with PCP at next visit.  At this time there does not appear to  be any evidence of an acute emergency medical condition and the patient appears stable for discharge with appropriate outpatient follow up. Diagnosis was discussed with patient who verbalizes understanding of care plan and is agreeable to discharge. I have discussed return precautions with patient who verbalizes understanding of return precautions. Patient encouraged to follow-up with their PCP. All questions answered. Patient has been discharged in good condition.  Patient's case discussed with Dr. Maryan Rued who agrees with plan to discharge with follow-up.   Note: Portions of this report may have been transcribed using voice recognition software. Every effort was made to ensure accuracy; however, inadvertent computerized transcription errors may still be present. Final Clinical Impressions(s) / ED Diagnoses   Final diagnoses:  Atypical chest pain  Elevated blood pressure reading    ED Discharge Orders    None       Gari Crown 08/25/19 1507    Blanchie Dessert, MD 09/01/19 1507

## 2019-08-25 NOTE — Telephone Encounter (Signed)
Thanks for update

## 2019-08-25 NOTE — Telephone Encounter (Signed)
Phone call from pt  Reported swelling in left foot, ankle and calf, with "a tight feeling behind the knee."  Stated there is some redness behind the knee.  Reported has hx of blood clot in leg and PE.  Stated that he traveled by car for 10 hr. trip, last week, but was careful to get out frequently, and move around.  Questioned about chest pain or SOB?  Stated he gets short of breath when he goes up an incline.  Reported has "had chest pain for years off and on", that started in his 10's.  Was vague about presence of chest pain recently.  Pt. reported he had extra Xarelto tablets from previous Rx., and took one dose on 11/1, prior to his long car trip, and then started taking them daily, the past several days, when his symptoms began.  Advised that he needs to be evaluated today.  Unable to reach office initially.  Encouraged pt. To await call from office re: further recommendations.  Advised to call EMS if any increased shortness of breath or chest pain.  Pt. Agreed.    Phone call to office.  Spoke with FC, [place].  Stated that Dr. Yong Channel was not in today.  Due to significant hx of blood clot and PE, triage nurse will send pt. To ER.  Phone call to pt.  Advised that Dr. Yong Channel is not available today.  Strongly advised pt. To go to ER due to his hx, and current symptoms.  Pt. Verb. Understanding.  Agreed with plan.       Reason for Disposition . [1] Thigh or calf pain AND [2] only 1 side AND [3] present > 1 hour  Answer Assessment - Initial Assessment Questions 1. ONSET: "When did the swelling start?" (e.g., minutes, hours, days)     Saturday or Sunday 2. LOCATION: "What part of the leg is swollen?"  "Are both legs swollen or just one leg?"     Behind the left knee; tightness comes and goes  3. SEVERITY: "How bad is the swelling?" (e.g., localized; mild, moderate, severe)  - Localized - small area of swelling localized to one leg  - MILD pedal edema - swelling limited to foot and ankle, pitting edema  < 1/4 inch (6 mm) deep, rest and elevation eliminate most or all swelling  - MODERATE edema - swelling of lower leg to knee, pitting edema > 1/4 inch (6 mm) deep, rest and elevation only partially reduce swelling  - SEVERE edema - swelling extends above knee, facial or hand swelling present     Swelling of foot, ankle and calf 4. REDNESS: "Does the swelling look red or infected?"     Some redness  5. PAIN: "Is the swelling painful to touch?" If so, ask: "How painful is it?"   (Scale 1-10; mild, moderate or severe)     No pain ; denied tenderness ; described as a "tightness"  6. FEVER: "Do you have a fever?" If so, ask: "What is it, how was it measured, and when did it start?"      denied 7. CAUSE: "What do you think is causing the leg swelling?"     Concerned about blood clot 8. MEDICAL HISTORY: "Do you have a history of heart failure, kidney disease, liver failure, or cancer?"     Hx of blood clot approx. 4 yrs. Ago  9. RECURRENT SYMPTOM: "Have you had leg swelling before?" If so, ask: "When was the last time?" "What happened that time?"  4-5 mos. Ago  10. OTHER SYMPTOMS: "Do you have any other symptoms?" (e.g., chest pain, difficulty breathing)      Shortness of breath with walking up an incline; has intermittent chest pain "ongoing for a long time"  11. PREGNANCY: "Is there any chance you are pregnant?" "When was your last menstrual period?"      n/a  Protocols used: LEG SWELLING AND EDEMA-A-AH

## 2019-09-01 ENCOUNTER — Other Ambulatory Visit: Payer: Self-pay

## 2019-09-01 MED ORDER — ALLOPURINOL 300 MG PO TABS: ORAL_TABLET | ORAL | 3 refills | Status: DC

## 2019-11-21 DIAGNOSIS — Z20822 Contact with and (suspected) exposure to covid-19: Secondary | ICD-10-CM | POA: Diagnosis not present

## 2019-12-29 DIAGNOSIS — H52203 Unspecified astigmatism, bilateral: Secondary | ICD-10-CM | POA: Diagnosis not present

## 2019-12-29 DIAGNOSIS — H5213 Myopia, bilateral: Secondary | ICD-10-CM | POA: Diagnosis not present

## 2020-01-05 ENCOUNTER — Ambulatory Visit: Payer: BC Managed Care – PPO | Attending: Internal Medicine

## 2020-01-05 DIAGNOSIS — Z23 Encounter for immunization: Secondary | ICD-10-CM

## 2020-01-05 NOTE — Progress Notes (Signed)
   Covid-19 Vaccination Clinic  Name:  Jonathon [name]    MRN: [id number] DOB: Nov 22, 1965  01/05/2020  [name] was observed post Covid-19 immunization for 15 minutes without incident. He was provided with Vaccine Information Sheet and instruction to access the V-Safe system.   [name] was instructed to call 911 with any severe reactions post vaccine: Marland Kitchen Difficulty breathing  . Swelling of face and throat  . A fast heartbeat  . A bad rash all over body  . Dizziness and weakness   Immunizations Administered    Name Date Dose VIS Date Route   Pfizer COVID-19 Vaccine 01/05/2020 10:57 AM 0.3 mL 09/23/2019 Intramuscular   Manufacturer: [place]   Lot: [id number]   [place]: [id number]

## 2020-01-30 ENCOUNTER — Ambulatory Visit: Payer: BC Managed Care – PPO | Attending: Internal Medicine

## 2020-01-30 DIAGNOSIS — Z23 Encounter for immunization: Secondary | ICD-10-CM

## 2020-01-30 NOTE — Progress Notes (Signed)
   Covid-19 Vaccination Clinic  Name:  Jonathon [name]    MRN: [id number] DOB: 1966/08/16  01/30/2020  [name] was observed post Covid-19 immunization for 15 minutes without incident. He was provided with Vaccine Information Sheet and instruction to access the V-Safe system.   [name] was instructed to [name] 911 with any severe reactions post vaccine: Marland Kitchen Difficulty breathing  . Swelling of face and throat  . A fast heartbeat  . A bad rash all over body  . Dizziness and weakness   Immunizations Administered    Name Date Dose VIS Date Route   Pfizer COVID-19 Vaccine 01/30/2020 10:38 AM 0.3 mL 12/07/2018 Intramuscular   Manufacturer: [place]   Lot: [id number]   [place]: [id number]

## 2020-06-16 IMAGING — CT CT ANGIO CHEST
1 of 5 series · 19 of 36 positions shown · IV contrast (Omnipaque)
Comparison: 07/19/2016

CLINICAL DATA: Bilateral leg swelling for 3 days and shortness of
breath.

EXAM:
CT ANGIOGRAPHY CHEST WITH CONTRAST
TECHNIQUE: Multidetector CT imaging of the chest was performed using the
standard protocol during bolus administration of intravenous
contrast. Multiplanar CT image reconstructions and MIPs were
obtained to evaluate the vascular anatomy.
CONTRAST:  100mL OMNIPAQUE IOHEXOL 350 MG/ML SOLN, 100mL OMNIPAQUE
IOHEXOL 350 MG/ML SOLN

[Series 9: pe lung · axial · 0.84mm/px · z∈[-284,-26]mm · 19 of 94 slices shown]
[im 4/94  lung]
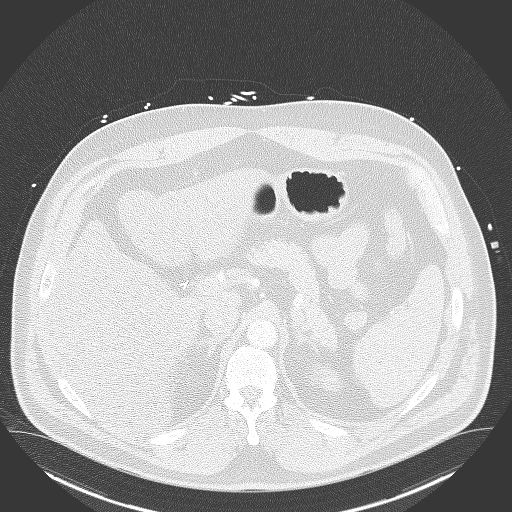
[im 8/94  mediastinal]
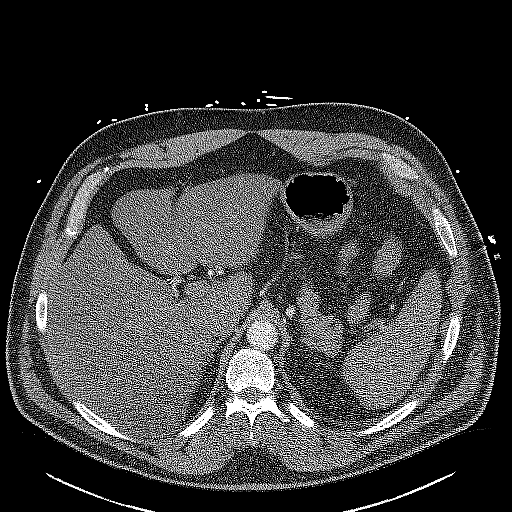
[im 15/94  lung]
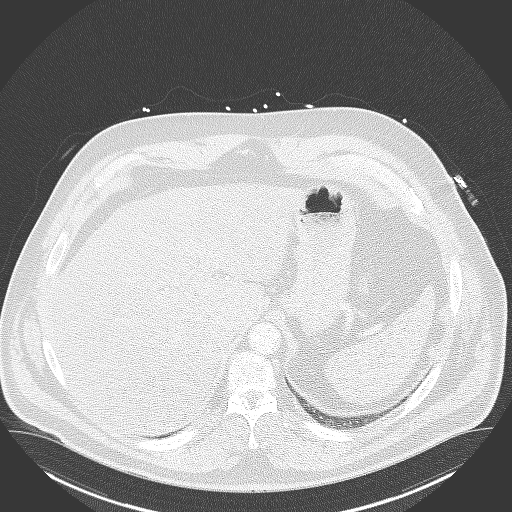
[im 18/94  mediastinal]
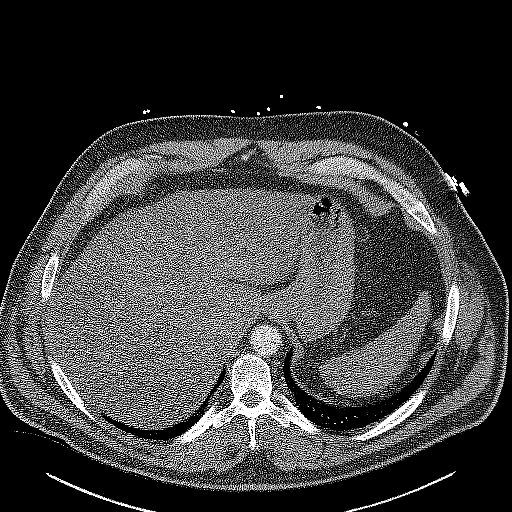
[im 22/94  lung]
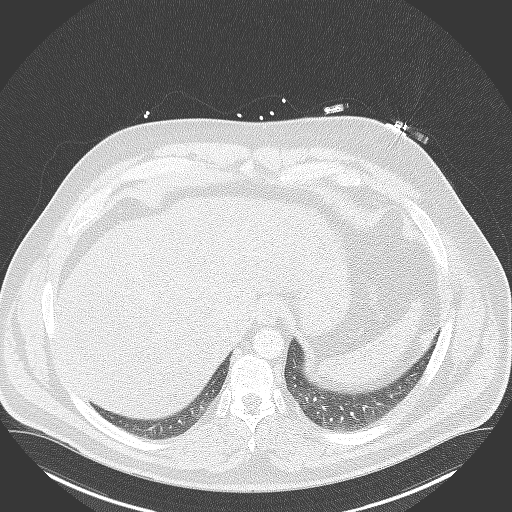
[im 29/94  mediastinal]
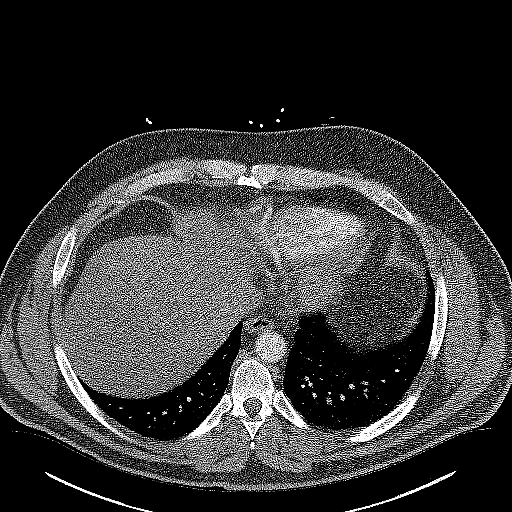
[im 33/94  lung]
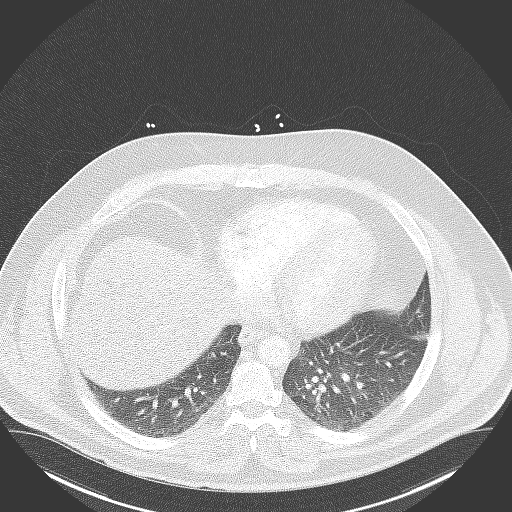
[im 36/94  mediastinal]
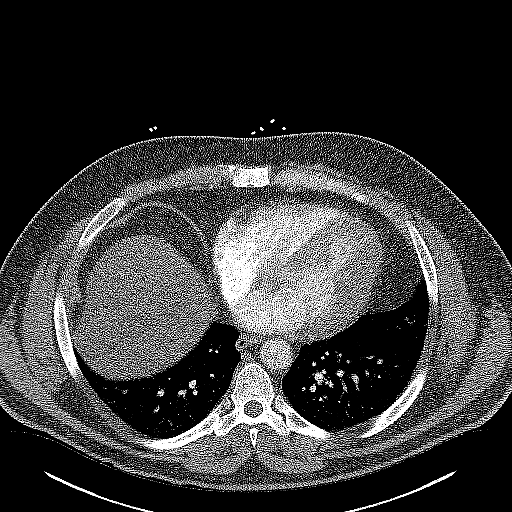
[im 43/94  lung]
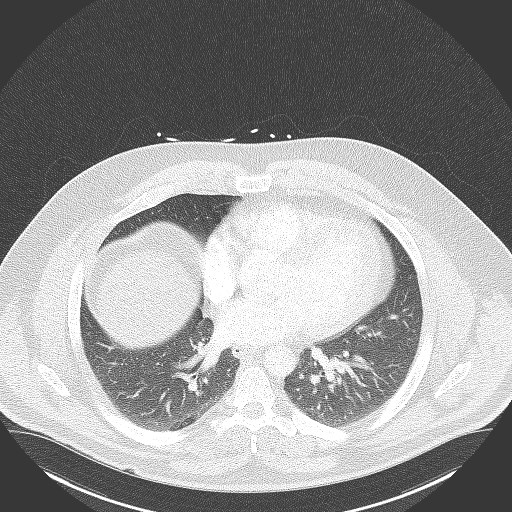
[im 47/94  mediastinal]
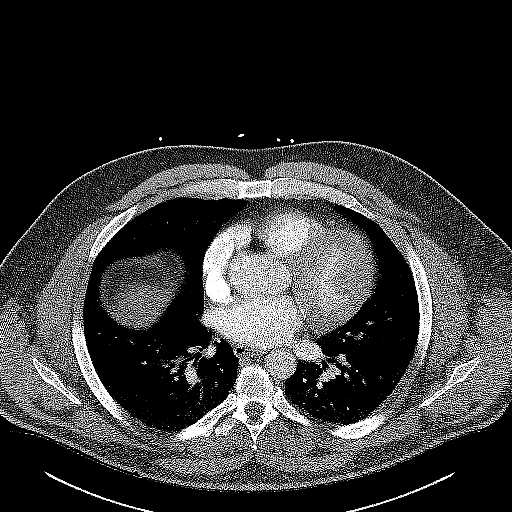
[im 51/94  lung]
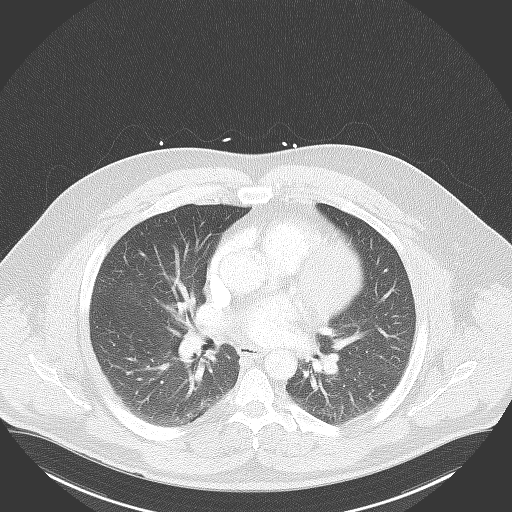
[im 58/94  mediastinal]
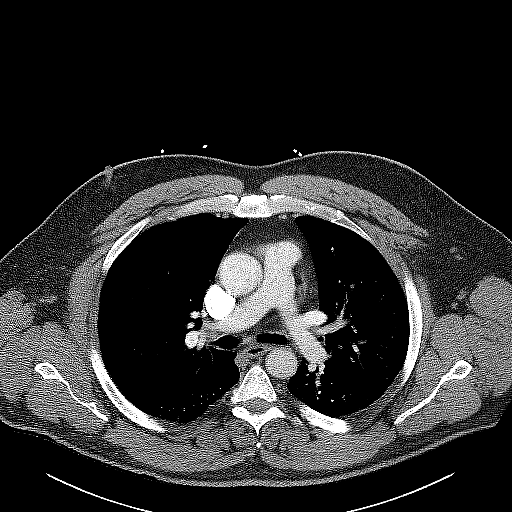
[im 61/94  lung]
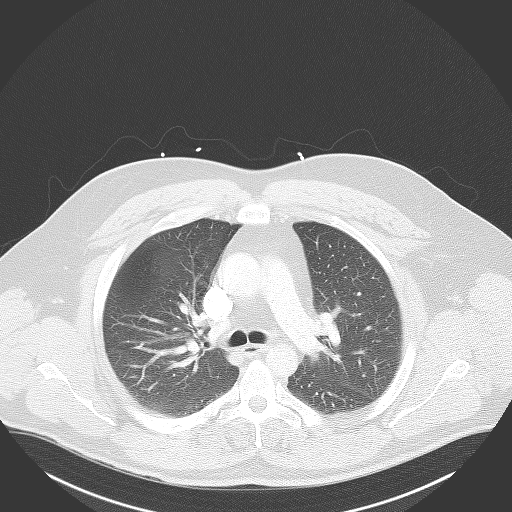
[im 65/94  mediastinal]
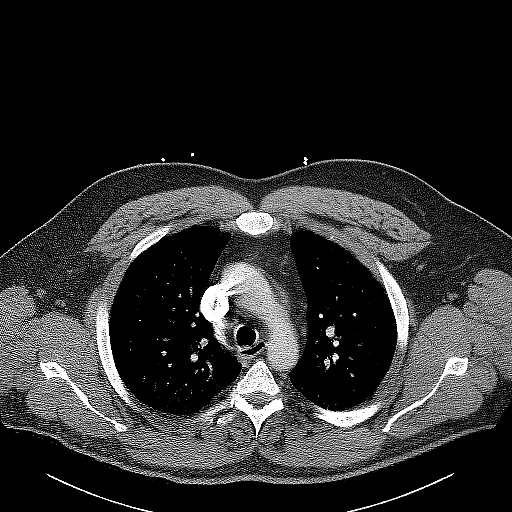
[im 72/94  lung]
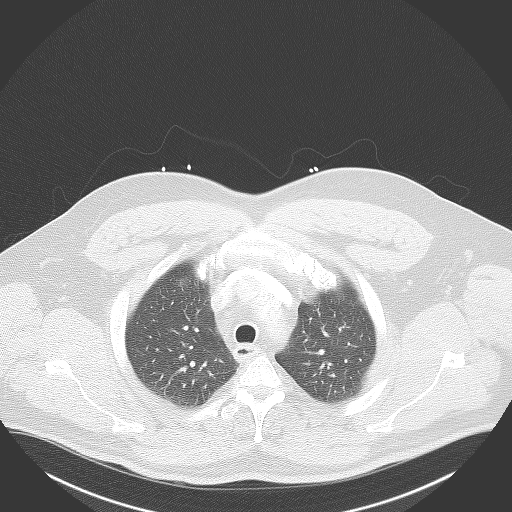
[im 76/94  mediastinal]
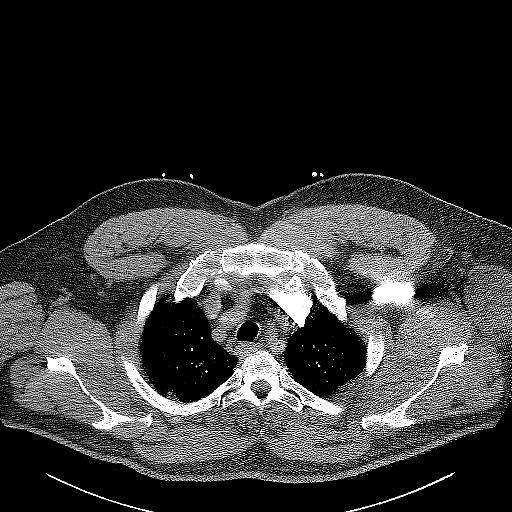
[im 79/94  lung]
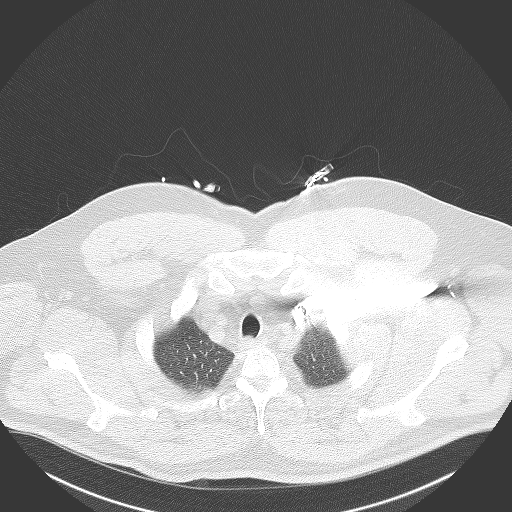
[im 86/94  mediastinal]
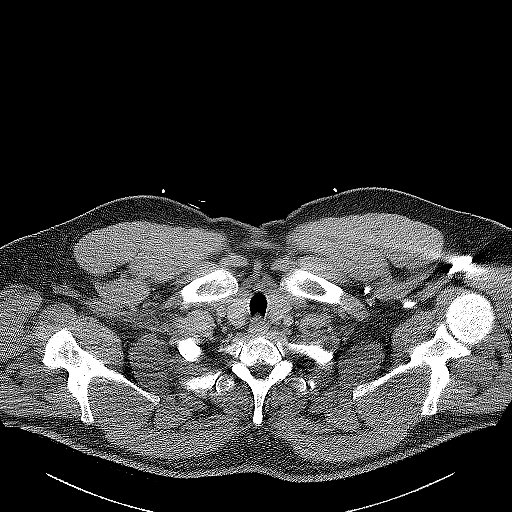
[im 90/94  lung]
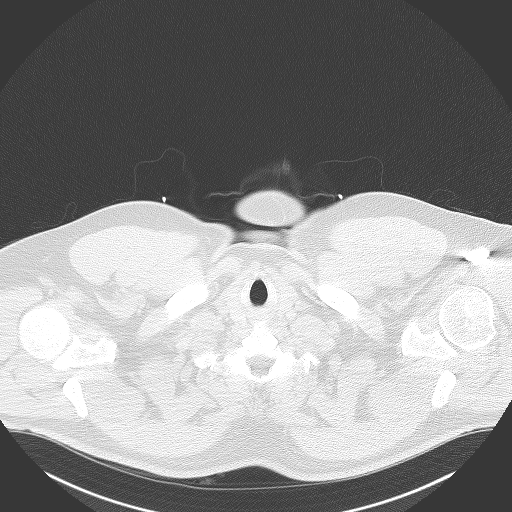

[19 of 36 positions shown; findings below may reference images not displayed]

FINDINGS: Cardiovascular: The heart is normal in size. No pericardial
effusion. The aorta is normal in caliber. No dissection. No
atherosclerotic calcifications. The branch vessels are patent. No
definite coronary artery calcifications.

The pulmonary arterial tree is fairly well opacified. No filling
defects to suggest pulmonary embolism.

Mediastinum/Nodes: No mediastinal or hilar mass or adenopathy. Small
scattered lymph nodes are stable. There is a small nodule in the
superior mediastinum near the lower medial border of the left
thyroid lobe which could be a small thyroid nodule. No change since
8631 consistent with a benign process.

Lungs/Pleura: The lungs are clear. No infiltrates, edema, effusions
or pulmonary lesions. No bronchiectasis or interstitial lung
disease.

Upper Abdomen: No significant upper abdominal findings.

Musculoskeletal: No chest wall mass, supraclavicular or axillary
adenopathy.

Review of the MIP images confirms the above findings.
IMPRESSION: 1. No CT findings for pulmonary embolism.
2. Normal thoracic aorta.
3. No acute pulmonary findings.

## 2020-06-28 NOTE — Patient Instructions (Addendum)
Health Maintenance Due  Topic Date Due  . Hepatitis C Screening - future labs Never done  . HIV Screening - future labs Never done  . INFLUENZA VACCINE Declined in office flu shot.  Never done   54 year old male with bilateral low back pain going back and forth radiating into buttocks with pain on straight leg raise- possible bulging disc. We will try round of prednisone and muscle relaxant. Hoping for improvement by Monday with full results within 2 weeks. If no significant improvement within 7-10 days I would like to get him into sports medicine. If he has new or worsening symptoms prior to that asked him to let us know  Recommended follow up:  Schedule physical at next available

## 2020-06-28 NOTE — Progress Notes (Signed)
Phone [phone] In person visit   Subjective:   Jonathon [name] is a 54 y.o. year old very pleasant male patient who presents for/with See problem oriented charting Chief Complaint  Patient presents with  . Back Pain   This visit occurred during the SARS-CoV-2 public health emergency.  Safety protocols were in place, including screening questions prior to the visit, additional usage of staff PPE, and extensive cleaning of exam room while observing appropriate contact time as indicated for disinfecting solutions.   Past Medical History-  Patient Active Problem List   Diagnosis Date Noted  . 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 History of pulmonary embolism 07/18/2016    Priority: High  . Gout 07/30/2016    Priority: Medium  . OSA (obstructive sleep apnea) 07/30/2016    Priority: Medium  . Essential hypertension 07/30/2016    Priority: Medium  . Traumatic bursitis 11/12/2017    Medications- reviewed and updated Current Outpatient Medications  Medication Sig Dispense Refill  . allopurinol (ZYLOPRIM) 300 MG tablet TAKE 1 TABLET BY MOUTH EVERY DAY IN THE MORNING 90 tablet 3  . aspirin 81 MG chewable tablet Chew by mouth daily.    . mupirocin ointment (BACTROBAN) 2 % Apply twice daily as needed to belly button 30 g 1  . cyclobenzaprine (FLEXERIL) 5 MG tablet Take 1-2 tablets (5-10 mg total) by mouth 3 (three) times daily as needed for muscle spasms (dont drive for 8 hours after taking). 40 tablet 0  . predniSONE (DELTASONE) 20 MG tablet Take 2 pills for 3 days, 1 pill for 4 days 10 tablet 0   No current facility-administered medications for this visit.     Objective:  BP 122/72   Pulse 67   Temp (!) 97.4 F (36.3 C) (Temporal)   Resp 18   Ht 5\' 10"  (1.778 m)   Wt 246 lb 6.4 oz (111.8 kg)   SpO2 96%   BMI 35.35 kg/m  Gen: NAD, resting comfortably CV: RRR no murmurs rubs or gallops Lungs: CTAB no crackles, wheeze, rhonchi Abdomen:  soft/nontender/nondistended/normal bowel sounds.  Ext: no edema Skin: warm, dry Back - Normal skin, Spine with normal alignment and no deformity.  No tenderness to vertebral process palpation.  Paraspinous muscles are not tender and without spasm.   Straight leg raise on both sides produces pain- worse on the left.  Neuro- no saddle anesthesia, 5/5 strength lower extremities    Assessment and Plan  # Low Back Pain- no midline pain but radiates from right to left low back S:Lower back pain that radiates down to his glutes at times. He has taken otc Aleve to relieve the pain. He stated that now it feels like a lingering pain.  - 20 years ago had sledding incident on saucer- hit a hole and has had intermittent issues since then but not usually as severe/long as today. Had been doing a fair amount of cleaning and wonders if that contributed.  Pain rating, quality, Location- 6/10 at present. Lower back. Started, duration- Labor Day Worse with -going from laying to sitting Relieved by- weight lifting belt helpful and ice. Better with laying Previous Treatment-aleve as above, has also taken oxycodone to help with sleep from an old surgery. Had an old muscle relaxant which helped as ewll Previous imaging-no recent imaging  ROS-No saddle anesthesia, bladder incontinence, fecal incontinence, weakness in extremity, numbness or tingling in extremity. History negative for trauma, history of cancer, fever, chills, unintentional weight  loss, recent bacterial infection, recent IV drug use, HIV, pain worse at night or while supine.  A/P: 54 year old male with bilateral low back pain going back and forth radiating into buttocks with pain on straight leg raise- possible bulging disk. We will try round of prednisone and muscle relaxant. Hoping for improvement by Monday with full results within 2 weeks. If no significant improvement within 7-10 days I would like to get him into sports medicine. If he has new or  worsening symptoms prior to that asked him to let us know  Recommended follow up:  Schedule physical at next available  Lab/Order associations:   ICD-10-CM   1. Acute left-sided low back pain, unspecified whether sciatica present  M54.5     Meds ordered this encounter  Medications  . predniSONE (DELTASONE) 20 MG tablet    Sig: Take 2 pills for 3 days, 1 pill for 4 days    Dispense:  10 tablet    Refill:  0  . cyclobenzaprine (FLEXERIL) 5 MG tablet    Sig: Take 1-2 tablets (5-10 mg total) by mouth 3 (three) times daily as needed for muscle spasms (dont drive for 8 hours after taking).    Dispense:  40 tablet    Refill:  0   Return precautions advised.  Garret Reddish, MD

## 2020-06-29 ENCOUNTER — Ambulatory Visit: Payer: BC Managed Care – PPO | Admitting: Family Medicine

## 2020-06-29 ENCOUNTER — Encounter: Payer: Self-pay | Admitting: Family Medicine

## 2020-06-29 ENCOUNTER — Other Ambulatory Visit: Payer: Self-pay

## 2020-06-29 VITALS — BP 122/72 | HR 67 | Temp 97.4°F | Resp 18 | Ht 70.0 in | Wt 246.4 lb

## 2020-06-29 DIAGNOSIS — M545 Low back pain, unspecified: Secondary | ICD-10-CM

## 2020-06-29 MED ORDER — PREDNISONE 20 MG PO TABS: ORAL_TABLET | ORAL | 0 refills | Status: DC

## 2020-06-29 MED ORDER — CYCLOBENZAPRINE HCL 5 MG PO TABS: 5.0000 mg | ORAL_TABLET | Freq: Three times a day (TID) | ORAL | 0 refills | Status: DC | PRN

## 2020-10-27 ENCOUNTER — Other Ambulatory Visit: Payer: Self-pay | Admitting: Family Medicine

## 2020-11-04 ENCOUNTER — Other Ambulatory Visit: Payer: Self-pay | Admitting: Family Medicine

## 2020-11-08 ENCOUNTER — Encounter: Payer: Self-pay | Admitting: Family Medicine

## 2020-12-12 NOTE — Patient Instructions (Addendum)
Please stop by lab before you go If you have mychart- we will send your results within 3 business days of [place] receiving them.  If you do not have mychart- we will call you about results within 5 business days of [place] receiving them.  *please also note that you will see labs on mychart as soon as they post. I will later go in and write notes on them- will say "notes from Dr. Yong Channel"  Blood pressure slightly high today but has had 2 highly caffeinated beverages prior to visit.  I asked him to purchase a home cuff and monitor at home over the next 2 weeks and then update me.  Thankfully it was normal at the dentist-I suspect may simply be related to caffeine-reasonable to cut down on this -omron series 3 is reasonable  For toe pain- No obvious significant abnormalities on exam.  He does not wear tennis shoes regularly-encouraged him to wear more supportive shoes and if fails to improve he will let us know and can refer to sports medicine, podiatry or orthopedics per his preference  Please check with your pharmacy to see if they have the shingrix vaccine. If they do- please get this immunization and update [place] by phone call or mychart with dates you receive the vaccine  Team please attempt irrigation right ear only (left ear normal).   If not effective can try debrox or Mineral oil for ear full of wax Purchase mineral oil from laxative aisle Lay down on your side with ear that is bothering you facing up Use 3-4 drops with a dropper and place in ear for 30 seconds Place cotton swab outside of ear Turn to other side and allow this to drain Repeat 3-4 x a day Return to see [place] if not improving within a few days   Recommended follow up: Return in about 6 months (around 06/19/2021) for follow up- or sooner if needed. unless home blood pressures all <135/85 and you feel good about your progess on lifestyle changes

## 2020-12-12 NOTE — Progress Notes (Signed)
Phone: [phone]   Subjective:  Patient presents today for their annual physical. Chief complaint-noted.   See problem oriented charting- ROS- full  review of systems was completed and negative  except for: right toe pain- worse in 2nd and 3rd toe  The following were reviewed and entered/updated in epic: Past Medical History:  Diagnosis Date  . Chicken pox   . Clotting disorder ([place])    DVT left leg, PE 2017  . Gout   . 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 Pulmonary embolism ([place]) 07/18/2016   Patient Active Problem List   Diagnosis Date Noted  . 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 History of pulmonary embolism 07/18/2016    Priority: High  . Gout 07/30/2016    Priority: Medium  . OSA (obstructive sleep apnea) 07/30/2016    Priority: Medium  . Essential hypertension 07/30/2016    Priority: Medium  . Traumatic bursitis 11/12/2017   Past Surgical History:  Procedure Laterality Date  . APPENDECTOMY  2003  . CHOLECYSTECTOMY  2002  . HERNIA REPAIR     as child- unsure location  . LASIK     similar to but not lasik per patient  . NASAL SEPTUM SURGERY  1996  . WISDOM TOOTH EXTRACTION      Family History  Problem Relation Age of Onset  . Heart attack Father 98  . Hypercalcemia Father   . Diabetes Father   . Hypertension Father   . Uterine cancer Mother   . Stroke Paternal Uncle   . Alcohol abuse Paternal Grandfather   . Other Sister        55 years older  . Brain cancer Brother        glioblastoma- 69 years older  . Other Brother        45 years older  . Colon cancer Neg Hx   . Colon polyps Neg Hx   . Esophageal cancer Neg Hx   . Rectal cancer Neg Hx   . Stomach cancer Neg Hx     Medications- reviewed and updated Current Outpatient Medications  Medication Sig Dispense Refill  . allopurinol (ZYLOPRIM) 300 MG tablet TAKE 1 TABLET BY MOUTH EVERY DAY IN THE MORNING 90 tablet 3  . aspirin 81 MG chewable tablet Chew by mouth daily.    .  cyclobenzaprine (FLEXERIL) 5 MG tablet Take 1-2 tablets (5-10 mg total) by mouth 3 (three) times daily as needed for muscle spasms (dont drive for 8 hours after taking). 40 tablet 0  . mupirocin ointment (BACTROBAN) 2 % APPLY TWICE DAILY AS NEEDED TO BELLY BUTTON 22 g 1   No current facility-administered medications for this visit.    Allergies-reviewed and updated No Known Allergies  Social History   Social History Narrative   Divorced. Daughter Elmyra Ricks (my patient). Lives with son matthew 47 in 2017. Also has family friend going through divorce living with him.       [employer].    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 Oropharynx normal.  Copious cerumen in right tympanic membrane-able to curette out substantial amount but still with cerumen  noted-irrigation attempted Neck: no thyromegaly CV: RRR no murmurs rubs or gallops Lungs: CTAB no crackles, wheeze, rhonchi Abdomen: soft/nontender/nondistended/normal bowel sounds. No rebound or guarding.  Ext: no edema Skin: warm, dry Neuro: grossly normal, moves all extremities, PERRLA    Assessment and Plan  55 y.o. male presenting for annual physical.  Health Maintenance counseling: 1. Anticipatory guidance: Patient counseled regarding regular dental exams -q6 months, eye exams -yearly,  avoiding smoking and second hand smoke , limiting alcohol to 2 beverages per day.   2. Risk factor reduction:  Advised patient of need for regular exercise and diet rich and fruits and vegetables to reduce risk of heart attack and stroke. Exercise- tues and thursdays 20 mins elliptical and then additional time 40 mins weight workout. Diet-up 19 lbs from September- tough stretch  through holidays- he has already reversed course and improving- trying to focus on healthier foods such as more salads, does 1-2 eggs and protein shake in the day. He feels pretty confident about trajectory- thinks could cut down on beers (still under 2 a day).  Wt Readings from Last 3 Encounters:  12/17/20 265 lb 3.2 oz (120.3 kg)  06/29/20 246 lb 6.4 oz (111.8 kg)  08/25/19 270 lb (122.5 kg)  3. Immunizations/screenings/ancillary studies- shingrix planned at pharmacy. HCV screen with labs Immunization History  Administered Date(s) Administered  . PFIZER(Purple Top)SARS-COV-2 Vaccination 01/05/2020, 01/30/2020, 09/26/2020  . Tdap 02/05/2017  4. Prostate cancer screening- high normal psa a few years ago- we will monitor PSA with labs   Lab Results  Component Value Date   PSA 3.07 07/20/2016   5. Colon cancer screening - 11/12/2018 with 3 year repeat due to polyps 6. Skin cancer screening- no dermatologist visits. advised regular sunscreen use. Denies worrisome, changing, or new skin lesions.  7. Never smoker 8. 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  I asked him to purchase a home cuff and monitor at home over the next 2 weeks and then update me.  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  On aspirin alone per Dr. Marin Olp who has released patient A/P: doing well on aspirin alone- continue to monitor   #Toe pain-right foot S: last august he had a golf weekend with some friends. [place] only and did 36 holes- was told walked 9 miles- toes havent been right since then- if walks a lot flares back up  A/P: No obvious significant abnormalities on exam.  He does not wear tennis shoes regularly-encouraged him to wear more supportive shoes and if fails to  improve he will let us know and can refer to sports medicine, podiatry or orthopedics per his preference  #irritation in belly button still responds to mupirocin  Recommended follow up: Return in about 6 months (around 06/19/2021) for follow up- or sooner if needed.  Lab/Order associations: NOT fasting   ICD-10-CM   1. Preventative health care  Z00.00 CBC with Differential/Platelet    Comprehensive metabolic panel    Lipid panel    HIV Antibody (routine testing w rflx)    Hepatitis C antibody    Uric acid    PSA  2. Essential hypertension  I10 CBC with Differential/Platelet    Comprehensive metabolic panel    Lipid panel  3. Idiopathic gout of multiple sites, unspecified chronicity  M10.09 Uric acid  4. Encounter for hepatitis C screening test for low risk patient  Z11.59 Hepatitis C antibody  5. Screening for HIV without presence of risk factors  Z11.4 HIV Antibody (routine testing w rflx)  6. Screening for prostate cancer  Z12.5 PSA   No orders of the defined types were placed in this encounter.  Return precautions advised.  Garret Reddish, MD

## 2020-12-17 ENCOUNTER — Ambulatory Visit (INDEPENDENT_AMBULATORY_CARE_PROVIDER_SITE_OTHER): Payer: BC Managed Care – PPO | Admitting: Family Medicine

## 2020-12-17 ENCOUNTER — Other Ambulatory Visit: Payer: Self-pay

## 2020-12-17 ENCOUNTER — Encounter: Payer: Self-pay | Admitting: Family Medicine

## 2020-12-17 VITALS — BP 142/96 | HR 62 | Temp 98.2°F | Ht 70.0 in | Wt 265.2 lb

## 2020-12-17 DIAGNOSIS — Z0000 Encounter for general adult medical examination without abnormal findings: Secondary | ICD-10-CM

## 2020-12-17 DIAGNOSIS — Z1159 Encounter for screening for other viral diseases: Secondary | ICD-10-CM | POA: Diagnosis not present

## 2020-12-17 DIAGNOSIS — M1009 Idiopathic gout, multiple sites: Secondary | ICD-10-CM | POA: Diagnosis not present

## 2020-12-17 DIAGNOSIS — Z114 Encounter for screening for human immunodeficiency virus [HIV]: Secondary | ICD-10-CM

## 2020-12-17 DIAGNOSIS — Z125 Encounter for screening for malignant neoplasm of prostate: Secondary | ICD-10-CM

## 2020-12-17 DIAGNOSIS — I10 Essential (primary) hypertension: Secondary | ICD-10-CM

## 2020-12-18 ENCOUNTER — Other Ambulatory Visit: Payer: Self-pay

## 2020-12-18 DIAGNOSIS — R972 Elevated prostate specific antigen [PSA]: Secondary | ICD-10-CM

## 2020-12-18 LAB — CBC WITH DIFFERENTIAL/PLATELET
Basophils Absolute: 0 10*3/uL (ref 0.0–0.1)
Basophils Relative: 0.8 % (ref 0.0–3.0)
Eosinophils Absolute: 0.3 10*3/uL (ref 0.0–0.7)
Eosinophils Relative: 5.4 % — ABNORMAL HIGH (ref 0.0–5.0)
HCT: 46.8 % (ref 39.0–52.0)
Hemoglobin: 16.1 g/dL (ref 13.0–17.0)
Lymphocytes Relative: 29.3 % (ref 12.0–46.0)
Lymphs Abs: 1.7 10*3/uL (ref 0.7–4.0)
MCHC: 34.5 g/dL (ref 30.0–36.0)
MCV: 89.7 fl (ref 78.0–100.0)
Monocytes Absolute: 0.5 10*3/uL (ref 0.1–1.0)
Monocytes Relative: 9.2 % (ref 3.0–12.0)
Neutro Abs: 3.2 10*3/uL (ref 1.4–7.7)
Neutrophils Relative %: 55.3 % (ref 43.0–77.0)
Platelets: 210 10*3/uL (ref 150.0–400.0)
RBC: 5.22 Mil/uL (ref 4.22–5.81)
RDW: 13.8 % (ref 11.5–15.5)
WBC: 5.7 10*3/uL (ref 4.0–10.5)

## 2020-12-18 LAB — COMPREHENSIVE METABOLIC PANEL
ALT: 22 U/L (ref 0–53)
AST: 20 U/L (ref 0–37)
Albumin: 4.6 g/dL (ref 3.5–5.2)
Alkaline Phosphatase: 99 U/L (ref 39–117)
BUN: 20 mg/dL (ref 6–23)
CO2: 28 mEq/L (ref 19–32)
Calcium: 10 mg/dL (ref 8.4–10.5)
Chloride: 102 mEq/L (ref 96–112)
Creatinine, Ser: 0.98 mg/dL (ref 0.40–1.50)
GFR: 87.36 mL/min (ref >60.00)
Glucose, Bld: 80 mg/dL (ref 70–99)
Potassium: 3.9 mEq/L (ref 3.5–5.1)
Sodium: 140 mEq/L (ref 135–145)
Total Bilirubin: 0.8 mg/dL (ref 0.2–1.2)
Total Protein: 7.1 g/dL (ref 6.0–8.3)

## 2020-12-18 LAB — HEPATITIS C ANTIBODY
Hepatitis C Ab: NONREACTIVE
SIGNAL TO CUT-OFF: 0.31 (ref <1.00)

## 2020-12-18 LAB — LIPID PANEL
Cholesterol: 180 mg/dL (ref 0–200)
HDL: 47.9 mg/dL (ref >39.00)
NonHDL: 131.82
Total CHOL/HDL Ratio: 4
Triglycerides: 211 mg/dL — ABNORMAL HIGH (ref 0.0–149.0)
VLDL: 42.2 mg/dL — ABNORMAL HIGH (ref 0.0–40.0)

## 2020-12-18 LAB — HIV ANTIBODY (ROUTINE TESTING W REFLEX): HIV 1&2 Ab, 4th Generation: NONREACTIVE

## 2020-12-18 LAB — PSA: PSA: 4.71 ng/mL — ABNORMAL HIGH (ref 0.10–4.00)

## 2020-12-18 LAB — URIC ACID: Uric Acid, Serum: 6.8 mg/dL (ref 4.0–7.8)

## 2020-12-18 LAB — LDL CHOLESTEROL, DIRECT: Direct LDL: 105 mg/dL

## 2020-12-18 NOTE — Progress Notes (Signed)
psa

## 2021-01-15 ENCOUNTER — Other Ambulatory Visit (INDEPENDENT_AMBULATORY_CARE_PROVIDER_SITE_OTHER): Payer: BC Managed Care – PPO

## 2021-01-15 ENCOUNTER — Other Ambulatory Visit: Payer: Self-pay

## 2021-01-15 DIAGNOSIS — R972 Elevated prostate specific antigen [PSA]: Secondary | ICD-10-CM

## 2021-01-15 LAB — PSA
PSA: 4.38
PSA: 4.38 ng/mL — ABNORMAL HIGH (ref 0.10–4.00)

## 2021-01-22 DIAGNOSIS — Z20822 Contact with and (suspected) exposure to covid-19: Secondary | ICD-10-CM | POA: Diagnosis not present

## 2021-01-22 DIAGNOSIS — Z03818 Encounter for observation for suspected exposure to other biological agents ruled out: Secondary | ICD-10-CM | POA: Diagnosis not present

## 2021-02-06 ENCOUNTER — Telehealth: Payer: Self-pay

## 2021-02-06 NOTE — Telephone Encounter (Signed)
Patient is getting a viral test tomorrow but would like to know if It is positive are we able to write a note in order to let him travel home due to him not exhibiting any symptoms Please call patient back  If not he is going to call back at 430 ish due to him not getting good service back there and patient is also unable to open my chart where is at

## 2021-02-06 NOTE — Telephone Encounter (Signed)
Patient is calling back regarding this and is wanting to know if he can travel back home while positive  He is having no symptoms  Please call patient to discuss

## 2021-02-06 NOTE — Telephone Encounter (Signed)
Unfortunately from the CDC patient has to be 10 days past testing positive and remains symptom-free to be able to travel  From cdc  "Do NOT travel if.You are sick, even if you recovered from COVID-19 within the past 90 days or are up to date with your COVID-19 vaccines.You tested positive for COVID-19. Do not travel until a full 10 days after your symptoms started or the date your positive test was taken if you had no symptoms." 2. "People who have recovered from COVID-19 can continue to test positive for up to 3 months after their infection. CDC does not recommend retesting within 3 months after a person with COVID-19 first developed symptoms of COVID-19 (or the date their sample was taken for their first positive viral diagnostic test if their infection was asymptomatic).  If you have had a positive viral test on a sample taken during the past 90 days, and you have met the criteria to travel, you may travel instead with your positive viral test results and a signed letter from a licensed healthcare provider or a public health official that states you have been cleared for travel according to CDC's travel guidance. The positive test result and letter together are referred to as "documentation of recovery."  A letter from your healthcare provider or a public health official that clears you to travel, must have information that identifies you personally (e.g., name and date of birth) that matches the personal identifiers on your passport or other travel documents. The letter must be signed and dated on official letterhead that contains the name, address, and phone number of the healthcare provider or public health official who signed the letter.  If you have recovered from COVID-19 but are not able to obtain documentation of recovery that fulfills the requirements, you will need to show a negative COVID-19 viral test result from a sample taken no more than 1 day before your flight to the [place] departs.  Even  if you have recovered from COVID-19, if you develop symptoms of COVID-19 you should isolate, not travel, and consult with a healthcare provider for testing recommendations."

## 2021-02-06 NOTE — Telephone Encounter (Signed)
Patient called after hours:  Caller states, in [place]. Was supposed to travel back to the states. Has Covid. At home tested positive. Having no symptoms. Girlfriend tested negative. Will be going to travel test tomorrow. If tested positive can he travel. Patient would like a call back.

## 2021-02-06 NOTE — Telephone Encounter (Signed)
LM for pt tcb, my chart message sent also.

## 2021-02-06 NOTE — Telephone Encounter (Signed)
Patient is calling in stating that if someone responds to not put it in Jonathon [name] as he is unable to open mychart while in [place] to call him and if he doesn't answer to leave a detailed message. Kevins question is wondering if the CDC recommends that he is able to fly after 10 days from first symptom or 10 days from the first positive test.

## 2021-02-06 NOTE — Telephone Encounter (Signed)
See below

## 2021-02-07 NOTE — Telephone Encounter (Signed)
Patient tested negative for covid.

## 2021-02-12 DIAGNOSIS — R351 Nocturia: Secondary | ICD-10-CM | POA: Diagnosis not present

## 2021-02-12 DIAGNOSIS — N401 Enlarged prostate with lower urinary tract symptoms: Secondary | ICD-10-CM | POA: Diagnosis not present

## 2021-02-12 DIAGNOSIS — R972 Elevated prostate specific antigen [PSA]: Secondary | ICD-10-CM | POA: Diagnosis not present

## 2021-04-04 DIAGNOSIS — C61 Malignant neoplasm of prostate: Secondary | ICD-10-CM | POA: Diagnosis not present

## 2021-04-04 DIAGNOSIS — R972 Elevated prostate specific antigen [PSA]: Secondary | ICD-10-CM | POA: Diagnosis not present

## 2021-04-11 DIAGNOSIS — C61 Malignant neoplasm of prostate: Secondary | ICD-10-CM | POA: Diagnosis not present

## 2021-04-16 ENCOUNTER — Encounter: Payer: Self-pay | Admitting: Family Medicine

## 2021-04-19 ENCOUNTER — Other Ambulatory Visit: Payer: Self-pay

## 2021-04-19 MED ORDER — AMLODIPINE BESYLATE 2.5 MG PO TABS: 2.5000 mg | ORAL_TABLET | Freq: Every day | ORAL | 3 refills | Status: DC

## 2021-04-23 DIAGNOSIS — C61 Malignant neoplasm of prostate: Secondary | ICD-10-CM | POA: Insufficient documentation

## 2021-04-23 NOTE — Progress Notes (Signed)
Radiation Oncology         [phone] ________________________________  Initial Outpatient Consultation  Name: Jonathon [name] MRN: [id number]  Date: 04/24/2021  DOB: 1966/07/22  QI:[id number], Jonathon Mars, MD  Jonathon Mallow, MD   REFERRING PHYSICIAN: [name], MD  DIAGNOSIS: 55 y.o. gentleman with Stage T1c adenocarcinoma of the prostate with Gleason score of 4+3, and PSA of 4.28.    ICD-10-CM   1. Malignant neoplasm of prostate ([place])  C61       HISTORY OF PRESENT ILLNESS: Jonathon [name] is a 55 y.o. male with a diagnosis of prostate cancer. He was noted to have an elevated PSA of 4.71 on 12/17/20 and 4.28 on 01/15/21 by his primary care physician, Dr. Garret [name].  Accordingly, he was referred for evaluation in urology by Dr. Gloriann [name] on 02/12/21,  digital rectal examination was performed at that time revealing no nodules.  The patient proceeded to transrectal ultrasound with 12 biopsies of the prostate on 04/04/21.  The prostate volume measured 71.5 cc.  Out of 12 core biopsies, 4 were positive.  The maximum Gleason score was 4+3, and this was seen in the right lateral apex.  The patient reviewed the biopsy results with his urologist and he has kindly been referred today for discussion of potential radiation treatment options.   PREVIOUS RADIATION THERAPY: No  PAST MEDICAL HISTORY:  Past Medical History:  Diagnosis Date   Chicken pox    Clotting disorder ([place])    DVT left leg, PE 2017   Gout    Left leg DVT ([place]) 07/30/2016   Pulmonary embolism ([place]) 07/18/2016      PAST SURGICAL HISTORY: Past Surgical History:  Procedure Laterality Date   APPENDECTOMY  2003   CHOLECYSTECTOMY  2002   HERNIA REPAIR     as child- unsure location   LASIK     similar to but not lasik per patient   NASAL SEPTUM SURGERY  1996   WISDOM TOOTH EXTRACTION      FAMILY HISTORY:  Family History  Problem Relation Age of Onset   Heart attack Father 54   Hypercalcemia Father     Diabetes Father    Hypertension Father    Uterine cancer Mother    Stroke Paternal Uncle    Alcohol abuse Paternal Grandfather    Other Sister        59 years older   Brain cancer Brother        glioblastoma- 28 years older   Other Brother        86 years older   Colon cancer Neg Hx    Colon polyps Neg Hx    Esophageal cancer Neg Hx    Rectal cancer Neg Hx    Stomach cancer Neg Hx     SOCIAL HISTORY:  Social History   Socioeconomic History   Marital status: Divorced    Spouse name: Not on file   Number of children: Not on file   Years of education: Not on file   Highest education level: Not on file  Occupational History   Not on file  Tobacco Use   Smoking status: Never   Smokeless tobacco: Never  Substance and Sexual Activity   Alcohol use: Yes    Alcohol/week: 5.0 - 6.0 standard drinks    Types: 5 - 6 Cans of beer per week    Comment: occasional heavy   Drug use: No   Sexual activity: Not on file  Other Topics Concern   Not on file  Social History Narrative   Divorced. Daughter Jonathon [name] (my patient). Lives with son Jonathon [name] 9 in 2017. Also has family friend going through divorce living with him.       [employer].    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  MEDICATIONS:  Current Outpatient Medications  Medication Sig Dispense Refill   allopurinol (ZYLOPRIM) 300 MG tablet TAKE 1 TABLET BY MOUTH EVERY DAY IN THE MORNING 90 tablet 3   amLODipine (NORVASC) 2.5 MG tablet Take 1 tablet (2.5 mg total) by mouth daily. 90 tablet 3   aspirin 81 MG chewable tablet Chew by mouth daily.     cyclobenzaprine (FLEXERIL) 5 MG  tablet Take 1-2 tablets (5-10 mg total) by mouth 3 (three) times daily as needed for muscle spasms (dont drive for 8 hours after taking). 40 tablet 0   mupirocin ointment (BACTROBAN) 2 % APPLY TWICE DAILY AS NEEDED TO BELLY BUTTON 22 g 1   No current facility-administered medications for this encounter.    REVIEW OF SYSTEMS:  On review of systems, the patient reports that he is doing well overall. He denies any chest pain, shortness of breath, cough, fevers, chills, night sweats, unintended weight changes. He denies any bowel disturbances, and denies abdominal pain, nausea or vomiting. He denies any new musculoskeletal or joint aches or pains. His IPSS was 5, indicating mild urinary symptoms. His SHIM was 25, indicating he does not have erectile dysfunction. A complete review of systems is obtained and is otherwise negative.    PHYSICAL EXAM:  Wt Readings from Last 3 Encounters:  12/17/20 265 lb 3.2 oz (120.3 kg)  06/29/20 246 lb 6.4 oz (111.8 kg)  08/25/19 270 lb (122.5 kg)   Temp Readings from Last 3 Encounters:  12/17/20 98.2 F (36.8 C) (Temporal)  06/29/20 (!) 97.4 F (36.3 C) (Temporal)  08/25/19 98 F (36.7 C) (Oral)   BP Readings from Last 3 Encounters:  12/17/20 (!) 142/96  06/29/20 122/72  08/25/19 (!) 145/100   Pulse Readings from Last 3 Encounters:  12/17/20 62  06/29/20 67  08/25/19 87    /10  In general this is a well appearing man in no acute distress. He's alert and oriented x4 and appropriate throughout the examination. Cardiopulmonary assessment is negative for acute distress, and he exhibits normal effort.     KPS = 100  100 - Normal; no complaints; no evidence of disease. 90   - Able to carry on normal activity; minor signs or symptoms of disease. 80   - Normal activity with effort; some signs or symptoms of disease. 60   - Cares for self; unable to carry on normal activity or to do active work. 60   - Requires occasional assistance, but is able to care  for most of his personal needs. 50   - Requires considerable assistance and frequent medical care. 58   - Disabled; requires special care and assistance. 2   - Severely disabled; hospital admission is indicated although death not imminent. 60   - Very sick; hospital admission necessary; active supportive treatment  necessary. 10   - Moribund; fatal processes progressing rapidly. 0     - Dead  Karnofsky DA, Abelmann [place], Craver LS and Burchenal [facility] [phone]) The use of the nitrogen mustards in the palliative treatment of carcinoma: with particular reference to bronchogenic carcinoma Cancer 1 634-56  LABORATORY DATA:  Lab Results  Component Value Date   WBC 5.7 12/17/2020   HGB 16.1 12/17/2020   HCT 46.8 12/17/2020   MCV 89.7 12/17/2020   PLT 210.0 12/17/2020   Lab Results  Component Value Date   NA 140 12/17/2020   K 3.9 12/17/2020   CL 102 12/17/2020   CO2 28 12/17/2020   Lab Results  Component Value Date   ALT 22 12/17/2020   AST 20 12/17/2020   ALKPHOS 99 12/17/2020   BILITOT 0.8 12/17/2020     RADIOGRAPHY: No results found.    IMPRESSION/PLAN: 1. 55 y.o. gentleman with Stage T1c adenocarcinoma of the prostate with Gleason score of 4+3, and PSA of 4.28.  We discussed the patient's workup and outlined the nature of prostate cancer in this setting. The patient's T stage, Gleason's score, and PSA put him into the Unfavorable Intermediate Risk (UIR) group. Accordingly, he is eligible for a variety of potential treatment options including brachytherapy, 5.5 weeks of external radiation, or prostatectomy. We discussed the available radiation techniques, and focused on the details and logistics of delivery. The patient may not be an ideal candidate for brachytherapy boost with a prostate volume of 71.5 prior to downsizing from hormone therapy. We discussed that based on his prostate volume, he would require beginning treatment with a 5 alpha reductase inhibitor and ADT for at least 3  months to allow for downsizing of the prostate prior to initiating radiotherapy. We discussed and outlined the risks, benefits, short and long-term effects associated with radiotherapy and compared and contrasted these with prostatectomy. We discussed the role of SpaceOAR gel in reducing the rectal toxicity associated with radiotherapy.   He appears to have a good understanding of his disease and our treatment recommendations which are of curative intent.  He was encouraged to ask questions that were answered to his stated satisfaction.  He initially had concerns about surgery including a possible risk of incontinence.  At the conclusion of our conversation, the patient is leaning towards moving forward with consultation with Dr. Alinda Money on 05/21/21.  I think some of his initial misgivings about prostatectomy were addressed today, and he is more comfortable with up-front surgery as option.  We reviewed the alternative of IMRT and also outlined the potential role of radiotherapy in the adjuvant or salvage setting post-prostatectomy if clinically indicated.  I personally spent 60 minutes in this encounter including chart review, reviewing radiological studies, meeting face-to-face with the patient, entering orders and completing documentation.       Tyler Pita, MD  [facility]  Radiation Oncology Direct Dial: [phone]  Fax: [phone] [email or web address]  Skype  LinkedIn

## 2021-04-23 NOTE — Progress Notes (Signed)
GU Location of Tumor / Histology:  Adenocarcinoma of the prostate  If Prostate Cancer, Gleason Score is (4 + 3), PSA (4.38 as of 01/15/21), and Prostate volume (71.5 g)  Hermina Staggers Wolke presented with signs/symptoms of: elevated PSA level  Biopsies revealed:  04/04/2021   Past/Anticipated interventions by urology, if any:  04/11/2021 Dr. Link Snuffer  04/04/2021 Dr. Link Snuffer Transrectal ultrasound of prostate with biopsy  Past/Anticipated interventions by medical oncology, if any:  No referral placed at this time  Weight changes, if any: Patient denies  IPSS Score: 5 (mild) SHIM Score: 25 (no ED)  Bowel/Bladder complaints, if any: Patient denies any changes in bowel habits or new urinary concerns. Reports he would be pleased if he had to live with his current urinary condition for the rest of his life  Nausea/Vomiting, if any: Patient denies  Pain issues, if any:  Patient denies  SAFETY ISSUES: Prior radiation? No Pacemaker/ICD? No Possible current pregnancy? N/A Is the patient on methotrexate? No  Current Complaints / other details:  Patient has received the first 3 Pfizer vaccines.

## 2021-04-24 ENCOUNTER — Ambulatory Visit
Admission: RE | Admit: 2021-04-24 | Discharge: 2021-04-24 | Disposition: A | Discharge status: Home / Self Care | Payer: BC Managed Care – PPO | Source: Ambulatory Visit | Attending: Radiation Oncology | Admitting: Radiation Oncology

## 2021-04-24 ENCOUNTER — Other Ambulatory Visit: Payer: Self-pay

## 2021-04-24 VITALS — BP 156/96 | HR 78 | Temp 98.3°F | Resp 19 | Ht 70.0 in | Wt 273.0 lb

## 2021-04-24 DIAGNOSIS — C61 Malignant neoplasm of prostate: Secondary | ICD-10-CM

## 2021-05-17 ENCOUNTER — Encounter: Payer: Self-pay | Admitting: Family Medicine

## 2021-05-17 DIAGNOSIS — C61 Malignant neoplasm of prostate: Secondary | ICD-10-CM | POA: Diagnosis not present

## 2021-05-21 ENCOUNTER — Encounter: Payer: Self-pay | Admitting: Family Medicine

## 2021-05-21 DIAGNOSIS — C61 Malignant neoplasm of prostate: Secondary | ICD-10-CM | POA: Diagnosis not present

## 2021-05-23 ENCOUNTER — Encounter: Payer: Self-pay | Admitting: Family Medicine

## 2021-05-28 ENCOUNTER — Other Ambulatory Visit: Payer: Self-pay | Admitting: Urology

## 2021-05-28 DIAGNOSIS — M6281 Muscle weakness (generalized): Secondary | ICD-10-CM | POA: Diagnosis not present

## 2021-05-28 DIAGNOSIS — M6289 Other specified disorders of muscle: Secondary | ICD-10-CM | POA: Diagnosis not present

## 2021-05-28 DIAGNOSIS — C61 Malignant neoplasm of prostate: Secondary | ICD-10-CM | POA: Diagnosis not present

## 2021-05-28 DIAGNOSIS — M62838 Other muscle spasm: Secondary | ICD-10-CM | POA: Diagnosis not present

## 2021-05-30 ENCOUNTER — Other Ambulatory Visit: Payer: Self-pay | Admitting: Urology

## 2021-06-04 ENCOUNTER — Emergency Department
Admission: EM | Admit: 2021-06-04 | Discharge: 2021-06-04 | Disposition: A | Discharge status: Home / Self Care | Payer: BC Managed Care – PPO | Source: Home / Self Care

## 2021-06-04 ENCOUNTER — Other Ambulatory Visit: Payer: Self-pay

## 2021-06-04 DIAGNOSIS — I10 Essential (primary) hypertension: Secondary | ICD-10-CM | POA: Diagnosis not present

## 2021-06-04 HISTORY — DX: Essential (primary) hypertension: I10

## 2021-06-04 MED ORDER — CHLORTHALIDONE 25 MG PO TABS: 12.5000 mg | ORAL_TABLET | Freq: Every day | ORAL | 1 refills | Status: DC

## 2021-06-04 MED ORDER — AMLODIPINE BESYLATE 10 MG PO TABS: 10.0000 mg | ORAL_TABLET | Freq: Every day | ORAL | 1 refills | Status: DC

## 2021-06-04 NOTE — ED Notes (Signed)
Jonathon [name] made aware of pt's s/s and BP. Ordered to check manual BP again in 15 mins.

## 2021-06-04 NOTE — ED Triage Notes (Signed)
Pt presents to Urgent Care with c/o elevated BP, HA, and "feeling foggy" since yesterday. Reports BP was 166/120 this AM. Grips are equal, tongue midline, smile symmetrical. Pt denies numbness and blurred vision.

## 2021-06-04 NOTE — ED Provider Notes (Signed)
[name]    CSN: [id number] Arrival date & time: 06/04/21  1315      History   Chief Complaint Chief Complaint  Patient presents with   Hypertension   Headache    HPI Jonathon [name] is a 55 y.o. male.   HPI 55 year old male presents with elevated blood pressure with diagnosis of hypertension.  Patient reports elevated blood pressure, headache and feeling foggy since yesterday.  Patient reports BP was 166/120 this morning at home.  Since presenting to urgent care this afternoon patient's blood pressures are the following (sequential order) 152/98, 146/102, 132/100, and 152/98.  PMH significant for pulmonary embolism, clotting disorder, and left leg DVT.  Is currently taking no anticoagulant medication  Past Medical History:  Diagnosis Date   Chicken pox    Clotting disorder ([place])    DVT left leg, PE 2017   Gout    Hypertension    Left leg DVT ([place]) 07/30/2016   Pulmonary embolism ([place]) 07/18/2016    Patient Active Problem List   Diagnosis Date Noted   Malignant neoplasm of prostate ([place]) 04/23/2021   Traumatic bursitis 11/12/2017   History of DVT (deep vein thrombosis) 07/30/2016   Gout 07/30/2016   OSA (obstructive sleep apnea) 07/30/2016   Essential hypertension 07/30/2016   History of pulmonary embolism 07/18/2016    Past Surgical History:  Procedure Laterality Date   APPENDECTOMY  2003   CHOLECYSTECTOMY  2002   HERNIA REPAIR     as child- unsure location   LASIK     similar to but not lasik per patient   [place] Medications    Prior to Admission medications   Medication Sig Start Date End Date Taking? Authorizing Provider  amLODipine (NORVASC) 10 MG tablet Take 1 tablet (10 mg total) by mouth daily. 06/04/21 07/04/21 Yes Eliezer Lofts, FNP  chlorthalidone (HYGROTON) 25 MG tablet Take 0.5 tablets (12.5 mg total) by mouth daily. 06/04/21 07/04/21 Yes Eliezer Lofts, FNP   allopurinol (ZYLOPRIM) 300 MG tablet TAKE 1 TABLET BY MOUTH EVERY DAY IN THE MORNING 11/04/20   Marin Olp, MD  aspirin 81 MG chewable tablet Chew by mouth daily.    [provider]  cyclobenzaprine (FLEXERIL) 5 MG tablet Take 1-2 tablets (5-10 mg total) by mouth 3 (three) times daily as needed for muscle spasms (dont drive for 8 hours after taking). 06/29/20   Marin Olp, MD  mupirocin ointment (BACTROBAN) 2 % APPLY TWICE DAILY AS NEEDED TO BELLY BUTTON 10/29/20   Marin Olp, MD    Family History Family History  Problem Relation Age of Onset   Heart attack Father 83   Hypercalcemia Father    Diabetes Father    Hypertension Father    Uterine cancer Mother    Stroke Paternal Uncle    Alcohol abuse Paternal Grandfather    Other Sister        37 years older   Brain cancer Brother        glioblastoma- 10 years older   Other Brother        60 years older   Colon cancer Neg Hx    Colon polyps Neg Hx    Esophageal cancer Neg Hx    Rectal cancer Neg Hx    Stomach cancer Neg Hx     Social History Social History   Tobacco Use   Smoking  status: Never   Smokeless tobacco: Never  Vaping Use   Vaping Use: Never used  Substance Use Topics   Alcohol use: Yes    Alcohol/week: 5.0 - 6.0 standard drinks    Types: 5 - 6 Cans of beer per week    Comment: weekly   Drug use: No     Allergies   Patient has no known allergies.   Review of Systems Review of Systems  Cardiovascular:        Elevated blood pressure x 2-3 days  Neurological:  Positive for headaches.    Physical Exam Triage Vital Signs ED Triage Vitals  Enc Vitals Group     BP 06/04/21 1329 (!) 152/98     Pulse Rate 06/04/21 1329 81     Resp 06/04/21 1329 20     Temp 06/04/21 1329 98.5 F (36.9 C)     Temp Source 06/04/21 1329 Oral     SpO2 06/04/21 1329 95 %     Weight 06/04/21 1331 265 lb (120.2 kg)     Height 06/04/21 1331 '5\' 10"'$  (1.778 m)     Head Circumference --      Peak Flow  --      Pain Score 06/04/21 1331 3     Pain Loc --      Pain Edu? --      Excl. in [place]? --    No data found.  Updated Vital Signs BP (!) 146/108 (BP Location: Left Arm)   Pulse 81   Temp 98.5 F (36.9 C) (Oral)   Resp 20   Ht '5\' 10"'$  (1.778 m)   Wt 265 lb (120.2 kg)   SpO2 95%   BMI 38.02 kg/m     Physical Exam Vitals and nursing note reviewed.  Constitutional:      General: He is not in acute distress.    Appearance: Normal appearance. He is obese. He is not ill-appearing, toxic-appearing or diaphoretic.  HENT:     Head: Normocephalic and atraumatic.     Mouth/Throat:     Mouth: Mucous membranes are moist.     Pharynx: Oropharynx is clear.  Eyes:     Extraocular Movements: Extraocular movements intact.     Conjunctiva/sclera: Conjunctivae normal.     Pupils: Pupils are equal, round, and reactive to light.  Neck:     Comments: No JVD, no bruit Cardiovascular:     Rate and Rhythm: Normal rate and regular rhythm.     Pulses: Normal pulses.     Heart sounds: Normal heart sounds. No murmur heard.   No friction rub. No gallop.  Pulmonary:     Effort: Pulmonary effort is normal.     Breath sounds: Normal breath sounds. No wheezing, rhonchi or rales.  Musculoskeletal:        General: Normal range of motion.     Cervical back: Normal range of motion and neck supple.  Skin:    General: Skin is warm and dry.  Neurological:     General: No focal deficit present.     Mental Status: He is alert and oriented to person, place, and time. Mental status is at baseline.     Cranial Nerves: No cranial nerve deficit.     Sensory: No sensory deficit.     Motor: No weakness.     Coordination: Coordination normal.  Psychiatric:        Mood and Affect: Mood normal.        Behavior: Behavior normal.  Thought Content: Thought content normal.     UC Treatments / Results  Labs (all labs ordered are listed, but only abnormal results are displayed) Labs Reviewed - No data to  display  EKG   Radiology No results found.  Procedures Procedures (including critical care time)  Medications Ordered in UC Medications - No data to display  Initial Impression / Assessment and Plan / UC Course  I have reviewed the triage vital signs and the nursing notes.  Pertinent labs & imaging results that were available during my care of the patient were reviewed by me and considered in my medical decision making (see chart for details).     MDM: 1.  Essential hypertension, benign-Rx'd Amlodipine 10 mg and Chlorthalidone 12.5 mg. Advised/instructed patient to discontinue amlodipine 2.5 mg and start Amlodipine 10 mg and Chlorthalidone 12.5 mg now, then begin taking both medications in the morning starting tomorrow morning, Wednesday, 06/05/2021.  Advised/instructed patient to take blood pressure twice daily (once in the morning prior to breakfast and once in the evening prior to dinner) for the next 7-10 days and to log measurements so that PCP will be able to better evaluate daily blood pressure trends.  Patient discharged home, hemodynamically stable Final Clinical Impressions(s) / UC Diagnoses   Final diagnoses:  Essential hypertension, benign     Discharge Instructions      Advised/instructed patient to discontinue amlodipine 2.5 mg and start Amlodipine 10 mg and Chlorthalidone 12.5 mg now, then begin taking both medications in the morning starting tomorrow morning, Wednesday, 06/05/2021.  Advised/instructed patient to take blood pressure twice daily (once in the morning prior to breakfast and once in the evening prior to dinner) for the next 7-10 days and to log measurements so that PCP will be able to better evaluate daily blood pressure trends.     ED Prescriptions     Medication Sig Dispense Auth. Provider   amLODipine (NORVASC) 10 MG tablet Take 1 tablet (10 mg total) by mouth daily. 30 tablet Eliezer Lofts, FNP   chlorthalidone (HYGROTON) 25 MG tablet Take 0.5  tablets (12.5 mg total) by mouth daily. 15 tablet Eliezer Lofts, FNP      PDMP not reviewed this encounter.   Eliezer Lofts, [place] 06/04/21 1505

## 2021-06-04 NOTE — Discharge Instructions (Addendum)
Advised/instructed patient to discontinue amlodipine 2.5 mg and start Amlodipine 10 mg and Chlorthalidone 12.5 mg now, then begin taking both medications in the morning starting tomorrow morning, Wednesday, 06/05/2021.  Advised/instructed patient to take blood pressure twice daily (once in the morning prior to breakfast and once in the evening prior to dinner) for the next 7-10 days and to log measurements so that PCP will be able to better evaluate daily blood pressure trends.

## 2021-06-04 NOTE — ED Notes (Signed)
Pt's s/s unchanged.

## 2021-06-11 ENCOUNTER — Encounter (HOSPITAL_COMMUNITY)
Admission: RE | Admit: 2021-06-11 | Discharge: 2021-06-11 | Disposition: A | Discharge status: Home / Self Care | Payer: BC Managed Care – PPO | Source: Ambulatory Visit | Attending: Urology | Admitting: Urology

## 2021-06-11 ENCOUNTER — Other Ambulatory Visit: Payer: Self-pay

## 2021-06-11 DIAGNOSIS — C61 Malignant neoplasm of prostate: Secondary | ICD-10-CM

## 2021-06-11 DIAGNOSIS — M62838 Other muscle spasm: Secondary | ICD-10-CM | POA: Diagnosis not present

## 2021-06-11 DIAGNOSIS — M6289 Other specified disorders of muscle: Secondary | ICD-10-CM | POA: Diagnosis not present

## 2021-06-11 DIAGNOSIS — M6281 Muscle weakness (generalized): Secondary | ICD-10-CM | POA: Diagnosis not present

## 2021-06-11 MED ORDER — TECHNETIUM TC 99M MEDRONATE IV KIT
21.5000 | PACK | Freq: Once | INTRAVENOUS | Status: AC
  Administered 2021-06-11: 21.5 via INTRAVENOUS

## 2021-06-18 ENCOUNTER — Other Ambulatory Visit: Payer: Self-pay

## 2021-06-18 ENCOUNTER — Ambulatory Visit: Payer: BC Managed Care – PPO | Admitting: Family Medicine

## 2021-06-18 ENCOUNTER — Encounter: Payer: Self-pay | Admitting: Family Medicine

## 2021-06-18 VITALS — BP 130/74 | HR 86 | Temp 97.6°F | Resp 16 | Ht 70.0 in | Wt 262.0 lb

## 2021-06-18 DIAGNOSIS — Z23 Encounter for immunization: Secondary | ICD-10-CM

## 2021-06-18 DIAGNOSIS — Z86718 Personal history of other venous thrombosis and embolism: Secondary | ICD-10-CM | POA: Diagnosis not present

## 2021-06-18 DIAGNOSIS — I10 Essential (primary) hypertension: Secondary | ICD-10-CM | POA: Diagnosis not present

## 2021-06-18 DIAGNOSIS — M1009 Idiopathic gout, multiple sites: Secondary | ICD-10-CM

## 2021-06-18 MED ORDER — AMLODIPINE BESYLATE 10 MG PO TABS: 10.0000 mg | ORAL_TABLET | Freq: Every day | ORAL | 3 refills | Status: DC

## 2021-06-18 MED ORDER — ALLOPURINOL 300 MG PO TABS: 300.0000 mg | ORAL_TABLET | Freq: Every day | ORAL | 3 refills | Status: DC

## 2021-06-18 NOTE — Patient Instructions (Addendum)
Health Maintenance Due  Topic Date Due   Zoster Vaccines- Shingrix (1 of 2)  Shingrix #1 today. Repeat injection in 2-5 months. Schedule a nurse visit for the 2nd injection before you leave today (at the check out desk)  Never done   COVID-19 Vaccine (4 - Booster for [employer] series)  I suggest waiting for the new variant specific COVID-19 vaccination.  12/25/2020   INFLUENZA VACCINE - declines for now  Never done   I am happy to see that your blood pressure is controlled!  For your compression stockings- we refilled these- let us know if we need to make adjustments  Sign release of information at the check out desk for last CT from [facility].  Recommended follow up: march 8th or later for physical

## 2021-06-18 NOTE — Progress Notes (Signed)
Phone [phone] In person visit   Subjective:   Jonathon [name] is a 55 y.o. year old very pleasant male patient who presents for/with See problem oriented charting Chief Complaint  Patient presents with   Hypertension    Pt reports new medication, feels okay, feels bp has improved denies physical sxs since starting new medication    This visit occurred during the SARS-CoV-2 public health emergency.  Safety protocols were in place, including screening questions prior to the visit, additional usage of staff PPE, and extensive cleaning of exam room while observing appropriate contact time as indicated for disinfecting solutions.   Past Medical History-  Patient Active Problem List   Diagnosis Date Noted   History of DVT (deep vein thrombosis) 07/30/2016    Priority: High   History of pulmonary embolism 07/18/2016    Priority: High   Gout 07/30/2016    Priority: Medium   OSA (obstructive sleep apnea) 07/30/2016    Priority: Medium   Essential hypertension 07/30/2016    Priority: Medium   Malignant neoplasm of prostate ([place]) 04/23/2021   Traumatic bursitis 11/12/2017    Medications- reviewed and updated Current Outpatient Medications  Medication Sig Dispense Refill   amLODipine (NORVASC) 10 MG tablet Take 1 tablet (10 mg total) by mouth daily. 30 tablet 1   aspirin 81 MG chewable tablet Chew by mouth daily.     chlorthalidone (HYGROTON) 25 MG tablet Take 0.5 tablets (12.5 mg total) by mouth daily. 15 tablet 1   mupirocin ointment (BACTROBAN) 2 % APPLY TWICE DAILY AS NEEDED TO BELLY BUTTON 22 g 1   allopurinol (ZYLOPRIM) 300 MG tablet Take 1 tablet (300 mg total) by mouth daily. 90 tablet 3   cyclobenzaprine (FLEXERIL) 5 MG tablet Take 1-2 tablets (5-10 mg total) by mouth 3 (three) times daily as needed for muscle spasms (dont drive for 8 hours after taking). (Patient not taking: Reported on 06/18/2021) 40 tablet 0   No current facility-administered medications for this  visit.     Objective:  BP 130/74   Pulse 86   Temp 97.6 F (36.4 C) (Temporal)   Resp 16   Ht '5\' 10"'$  (1.778 m)   Wt 262 lb (118.8 kg)   SpO2 97%   BMI 37.59 kg/m  Gen: NAD, resting comfortably CV: RRR no murmurs rubs or gallops Lungs: CTAB no crackles, wheeze, rhonchi Ext: trace edema but equal Skin: warm, dry     Assessment and Plan   #Prostate cancer-patient has upcoming robotic assisted prostatectomy in October on the 20th with Dr. Alinda Money.  He was referred last year with elevated PSA  # Urgent Care F/U for Essential Hypertension S:S: medication: chlorthalidone 25 mg - half tablet so 12.5 mg daily and amlodipine 10 mg daily. At home using wrist cuff #s improving with above  57 year old patient presented to Urgent Care 06/04/21 with elevated blood pressure. Reported symptoms of having a high BP, headache, and 'ffeling froggy" the day before. At home blood pressure reading taken that morning was 166/120 - while in Urgent care that afternoon the following blood pressure reading were recorded (sequentially): 152/98, 146/102, 132/100, and 152/98.  No labs/imaging was ordered during the encounter  He was advised to start Chlorthalidone 25 mg that day - 12.5 mg tablet. Patient was also discontinued on Amlodipine 2.5 mg and place on 10 mg dose. He was instructed to take both medications in the morning on Wednesday 06/05/21 and continue for daily use. Instructed to take blood pressure readings  twice daily and log the readings for PCP evaluation - once in the AM before breakfast and once in the PM before dinner  - he was to do this 7-10 days.  Symptoms resolved with better BP BP Readings from Last 3 Encounters:  06/18/21 130/74  06/04/21 (!) 146/108  04/24/21 (!) 156/96  A/P: Controlled. Continue current medications   #Gout S: 0 flares in 6 months on allopurinol 300 mg in the AM daily.  We discussed recent addition of chlorthalidone could increase risk of gout flares. 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  On aspirin alone per Dr. Marin Olp who has released patient. Instructed to continue to monitor -We filled out a prescription for 20 to 30 mmHg thigh-high compression stockings-I am happy to adjust this prescription to match what Dr. Marin Olp gave him in the past-he will reach out if needed by phone call or MyChart  #irritation in belly button still responds to mupirocin  #HM- shingrix vaccine today (team will go back and add later- Noah Delaine from [place]) - had covid in April when was in [place]. As of now wants to hold off on next vaccine.   Recommended follow up: march 8th or later for physical Future Appointments  Date Time Provider [place]  07/09/2021 11:00 AM [facility] PAT 3 [facility] None    Lab/Order associations:   ICD-10-CM   1. Essential hypertension  I10     2. Idiopathic gout of multiple sites, unspecified chronicity  M10.09     3. History of DVT (deep vein thrombosis)  Z86.718     4. Need for shingles vaccine  Z23 Varicella-zoster vaccine IM     Meds ordered this encounter  Medications   allopurinol (ZYLOPRIM) 300 MG tablet    Sig: Take 1 tablet (300 mg total) by mouth daily.    Dispense:  90 tablet    Refill:  3   I,Harris Phan,acting as a scribe for Garret Reddish, MD.,have documented all relevant documentation on the behalf of Garret Reddish, MD,as directed by  Garret Reddish, MD while in the presence of Garret Reddish, MD.  I, Garret Reddish, MD, have reviewed all documentation for this visit. The documentation on 06/18/21  for the exam, diagnosis, procedures, and orders are all accurate and complete.  Return precautions advised.  Garret Reddish, MD

## 2021-06-19 NOTE — Addendum Note (Signed)
Addended byHildred Alamin on: 06/19/2021 11:15 AM   Modules accepted: Orders

## 2021-06-25 ENCOUNTER — Ambulatory Visit: Payer: BC Managed Care – PPO | Admitting: Family Medicine

## 2021-07-08 NOTE — Patient Instructions (Addendum)
DUE TO COVID-19 ONLY ONE VISITOR IS ALLOWED TO COME WITH YOU AND STAY IN THE WAITING ROOM ONLY DURING PRE OP AND PROCEDURE DAY OF SURGERY IF YOU ARE GOING HOME AFTER SURGERY. IF YOU ARE SPENDING THE NIGHT 2 PEOPLE MAY VISIT WITH YOU IN YOUR PRIVATE ROOM AFTER SURGERY UNTIL VISITING  HOURS ARE OVER AT 8:00 PM AND 1 VISITOR CAN SPEND THE NIGHT.   YOU NEED TO HAVE A COVID 19 TEST ON__10/18___ THIS TEST MUST BE DONE BEFORE SURGERY,  COVID TESTING SITE  IS LOCATED AT [place], [place]. REMAIN IN YOUR CAR THIS IS A DRIVE UP TEST. AFTER YOUR COVID TEST PLEASE WEAR A MASK OUT IN PUBLIC AND SOCIAL DISTANCE AND [name] YOUR HANDS FREQUENTLY, ALSO ASK ALL YOUR CLOSE CONTACT PERSONS TO WEAR A MASK AND SOCIAL DISTANCE AND [name] THEIR HANDS FREQUENTLY ALSO.               Jonathon [name]     Your procedure is scheduled on: 08/01/21   Report to [facility] Main  Entrance   Report to admitting at   8:15 AM     Call this number if you have problems the morning of surgery [phone]    Remember: Do not eat food  :After Midnight the night before your surgery,     You may have clear liquids from midnight until ---.8:00  am    CLEAR LIQUID DIET   Foods Allowed                                                                     Foods Excluded  Coffee and tea, regular and decaf                             liquids that you cannot  Plain Jell-O any favor except red or purple                                           see through such as: Fruit ices (not with fruit pulp)                                     milk, soups, orange juice  Iced Popsicles                                    All solid food Carbonated beverages, regular and diet                                    Cranberry, grape and apple juices Sports drinks like Gatorade Lightly seasoned clear broth or consume(fat free) Sugar    BRUSH YOUR TEETH MORNING OF SURGERY AND RINSE YOUR MOUTH OUT, NO CHEWING GUM CANDY OR  MINTS.     Take these medicines the morning of surgery with A SIP OF WATER: Amlodipine, Allopurinol  Stop taking __ASA 81_________on _10/14_________as instructed by __Dr. Borden___________.                                   You may not have any metal on your body including              piercings  Do not wear jewelry,  lotions, powders or  deodorant                          Men may shave face and neck.   Do not bring valuables to the hospital. [place].  Contacts, dentures or bridgework may not be worn into surgery.                  [place] - Preparing for Surgery Before surgery, you can play an important role.  Because skin is not sterile, your skin needs to be as free of germs as possible.  You can reduce the number of germs on your skin by washing with CHG (chlorahexidine gluconate) soap before surgery.  CHG is an antiseptic cleaner which kills germs and bonds with the skin to continue killing germs even after washing. Please DO NOT use if you have an allergy to CHG or antibacterial soaps.  If your skin becomes reddened/irritated stop using the CHG and inform your nurse when you arrive at Short Stay.  You may shave your face/neck. Please follow these instructions carefully:  1.  Shower with CHG Soap the night before surgery and the  morning of Surgery.  2.  If you choose to wash your hair, wash your hair first as usual with your  normal  shampoo.  3.  After you shampoo, rinse your hair and body thoroughly to remove the  shampoo.                            4.  Use CHG as you would any other liquid soap.  You can apply chg directly  to the skin and wash                       Gently with a scrungie or clean washcloth.  5.  Apply the CHG Soap to your body ONLY FROM THE NECK DOWN.   Do not use on face/ open                           Wound or open sores. Avoid contact with eyes, ears mouth and genitals (private parts).                        Wash face,  Genitals (private parts) with your normal soap.             6.  Wash thoroughly, paying special attention to the area where your surgery  will be performed.  7.  Thoroughly rinse your body with warm water from the neck down.  8.  DO NOT shower/wash with your normal soap after using and rinsing off  the CHG Soap.             9.  Pat yourself dry with a clean towel.  10.  Wear clean pajamas.            11.  Place clean sheets on your bed the night of your first shower and do not  sleep with pets. Day of Surgery : Do not apply any lotions/deodorants the morning of surgery.  Please wear clean clothes to the hospital/surgery center.  FAILURE TO FOLLOW THESE INSTRUCTIONS MAY RESULT IN THE CANCELLATION OF YOUR SURGERY PATIENT SIGNATURE_________________________________  NURSE SIGNATURE__________________________________  ________________________________________________________________________

## 2021-07-09 ENCOUNTER — Encounter (HOSPITAL_COMMUNITY)
Admission: RE | Admit: 2021-07-09 | Discharge: 2021-07-09 | Disposition: A | Discharge status: Home / Self Care | Payer: BC Managed Care – PPO | Source: Ambulatory Visit | Attending: Urology | Admitting: Urology

## 2021-07-09 ENCOUNTER — Other Ambulatory Visit: Payer: Self-pay

## 2021-07-09 ENCOUNTER — Encounter (HOSPITAL_COMMUNITY): Payer: Self-pay

## 2021-07-09 DIAGNOSIS — Z01818 Encounter for other preprocedural examination: Secondary | ICD-10-CM | POA: Diagnosis not present

## 2021-07-09 HISTORY — DX: Peripheral vascular disease, unspecified: I73.9

## 2021-07-09 HISTORY — DX: Malignant (primary) neoplasm, unspecified: C80.1

## 2021-07-09 LAB — CBC
HCT: 48.5 % (ref 39.0–52.0)
Hemoglobin: 16.6 g/dL (ref 13.0–17.0)
MCH: 30.8 pg (ref 26.0–34.0)
MCHC: 34.2 g/dL (ref 30.0–36.0)
MCV: 90 fL (ref 80.0–100.0)
Platelets: 226 10*3/uL (ref 150–400)
RBC: 5.39 MIL/uL (ref 4.22–5.81)
RDW: 13.2 % (ref 11.5–15.5)
WBC: 6.3 10*3/uL (ref 4.0–10.5)
nRBC: 0 % (ref 0.0–0.2)

## 2021-07-09 LAB — BASIC METABOLIC PANEL
Anion gap: 10 (ref 5–15)
BUN: 16 mg/dL (ref 6–20)
CO2: 27 mmol/L (ref 22–32)
Calcium: 9.7 mg/dL (ref 8.9–10.3)
Chloride: 100 mmol/L (ref 98–111)
Creatinine, Ser: 0.96 mg/dL (ref 0.61–1.24)
GFR, Estimated: >60 mL/min (ref >60)
Glucose, Bld: 80 mg/dL (ref 70–99)
Potassium: 3.8 mmol/L (ref 3.5–5.1)
Sodium: 137 mmol/L (ref 135–145)

## 2021-07-09 NOTE — Progress Notes (Signed)
COVID test 07/30/21  PCP - Dr. Yong Channel LOV 06/18/21 Cardiologist - none  Chest x-ray - no EKG - 07/09/21-chart Stress Test - no ECHO - no Cardiac Cath - no Pacemaker/ICD device last checked:NA DVT and PE 2017- Dr. Debara Pickett  Sleep Study - yes CPAP - no Pt can't tolerate the mask   Fasting Blood Sugar - NA Checks Blood Sugar _____ times a day  Blood Thinner Instructions:ASA/81/ Dr/ P. Ennever Aspirin Instructions:Stop 7 days prior to DOS/ Dr. Alinda Money. Pt got paperwork weeks ago and will review it closer to the surgery for when to stop ASA and supplements as well as bowel prep. Last Dose:07/24/21  Anesthesia review: yes  Patient denies shortness of breath, fever, cough and chest pain at PAT appointment Pt reports no SOB with any activities  Patient verbalized understanding of instructions that were given to them at the PAT appointment. Patient was also instructed that they will need to review over the PAT instructions again at home before surgery. Yes. I told Pt to call Dr if information about ASA and bowel prep are not in his paperwork.

## 2021-07-16 DIAGNOSIS — C61 Malignant neoplasm of prostate: Secondary | ICD-10-CM | POA: Diagnosis not present

## 2021-07-24 NOTE — Progress Notes (Signed)
Anesthesia Chart Review   Case: [id number] Date/Time: 08/01/21 1100   Procedures:      XI ROBOTIC ASSISTED LAPAROSCOPIC RADICAL PROSTATECTOMY LEVEL 2     LYMPHADENECTOMY, PELVIC (Bilateral)   Anesthesia type: General   Pre-op diagnosis: PROSTATE CANCER   Location: [facility] / [facility]   Surgeons: Raynelle Bring, MD       DISCUSSION:55 y.o. never smoker with h/o HTN, PE, PVD, prostate cancer scheduled for above procedure 08/01/2021 with Dr. Raynelle Bring.   Pt last seen by PCP 06/18/2021.  Elevated BP at PAT visit.  He will continue to monitor at home and contact PCP if it remains elevated.  Evaluate DOS.  VS: BP (!) 145/99   Pulse 85   Temp 36.8 C (Oral)   Resp 20   Ht 5\' 10"  (1.778 m)   Wt 127 kg   SpO2 97%   BMI 40.18 kg/m   PROVIDERS: Marin Olp, MD is PCP    LABS: Labs reviewed: Acceptable for surgery. (all labs ordered are listed, but only abnormal results are displayed)  Labs Reviewed  CBC  BASIC METABOLIC PANEL     IMAGES:   EKG: 07/09/2021 Rate 72 bpm  Normal sinus rhythm with sinus arrhythmia Cannot rule out Anterior infarct , age undetermined Cannot rule out Inferior infarct , age undetermined  CV: Echo 07/19/2016  - Left ventricle: The cavity size was normal. There was moderate    concentric hypertrophy. Systolic function was normal. The    estimated ejection fraction was in the range of 55% to 60%.    Although no diagnostic regional wall motion abnormality was    identified, this possibility cannot be completely excluded on the    basis of this study. Doppler parameters are consistent with    abnormal left ventricular relaxation (grade 1 diastolic    dysfunction).  Past Medical History:  Diagnosis Date   Cancer [facility])    prostate   Chicken pox    Gout    Hypertension    Left leg DVT ([place]) 07/30/2016   Peripheral vascular disease ([place])    Pulmonary embolism ([place]) 07/18/2016    Past Surgical History:  Procedure Laterality Date    APPENDECTOMY  2003   CHOLECYSTECTOMY  2002   HERNIA REPAIR     as child- unsure location   LASIK     similar to but not lasik per patient   NASAL SEPTUM SURGERY  1996   WISDOM TOOTH EXTRACTION      MEDICATIONS:  allopurinol (ZYLOPRIM) 300 MG tablet   amLODipine (NORVASC) 10 MG tablet   aspirin EC 81 MG tablet   chlorthalidone (HYGROTON) 25 MG tablet   Coenzyme Q10 (COQ-10 PO)   Cyanocobalamin (B-12 PO)   GINKGO BILOBA PO   mupirocin ointment (BACTROBAN) 2 %   Omega-3 Fatty Acids (FISH OIL PO)   TURMERIC PO   No current facility-administered medications for this encounter.     Konrad Felix Ward, PA-C [facility] Pre-Surgical Testing [phone]

## 2021-07-26 ENCOUNTER — Encounter: Payer: Self-pay | Admitting: Family Medicine

## 2021-07-26 ENCOUNTER — Other Ambulatory Visit: Payer: Self-pay

## 2021-07-26 MED ORDER — CHLORTHALIDONE 25 MG PO TABS: 12.5000 mg | ORAL_TABLET | Freq: Every day | ORAL | 3 refills | Status: DC

## 2021-07-30 ENCOUNTER — Other Ambulatory Visit: Payer: Self-pay | Admitting: Urology

## 2021-07-31 LAB — SARS CORONAVIRUS 2 (TAT 6-24 HRS): SARS Coronavirus 2: NEGATIVE

## 2021-07-31 NOTE — H&P (Signed)
Office Visit Report     07/16/2021   --------------------------------------------------------------------------------   Jonathon [name]  MRN: [id number]  DOB: 1965-12-11, 55 year old Male  SSN:    PRIMARY CARE:  [name]. Melanee Spry, MD  REFERRING:  Raynelle Bring, Eduardo Osier  PROVIDER:  Link Snuffer, III, M.D.  TREATING:  [place], [place]  LOCATION:  [facility] [phone]     --------------------------------------------------------------------------------   CC/HPI: Pt presents today for pre-operative history and physical exam in anticipation of robotic assisted lap radical prostatectomy with bilateral pelvic lymph node dissection by Dr. Alinda Money on 08/01/21. He is doing well and is without complaint.   Pt denies F/C, HA, CP, SOB, N/V, diarrhea/constipation, back pain, flank pain, hematuria, and dysuria.     HX:   CC: Prostate Cancer   Physician requesting consult: Dr. Alen Blew  PCP: Dr. Garret Reddish   Jonathon [name] is a 55 year old gentleman who was found to have an elevated PSA of 4.38 prompting a TRUS biopsy of the prostate on 04/04/21. This confirmed Gleason 4+3=7 adenocarcinoma with 4 out of 12 biopsy cores positive for malignancy. He has been evaluated by Dr. Tammi Klippel in radiation oncology and has also received a 2nd opinion at [facility].   Family history: His father was diagnosed with prostate cancer in his late 78s and had undergone surgical treatment.   Imaging studies: None.   PMH: He has a history of gout, sleep apnea, and history of DVT/PE after a long flight (now on ASA 81 mg). He had been on Xarelto for approximately 2 years after his DVT was diagnosed in 2017 but was able to stop chronic anticoagulation. He has not had other episodes of DVT/PE.  PSH: Laparoscopic appendectomy, laparoscopic cholecystectomy, and open inguinal hernia repair.   TNM stage: cT1c Nx Mx  PSA: 4.38  Gleason score: 4+3=7 (GG 3)  Biopsy (04/04/21): 4/12  cores positive  Left: Benign  Right: R lateral apex (20%, 4+3=7), R mid (20%, 3+3=6), R lateral mid (20%, 3+3=6), R lateral base (50%, 3+4=7)  Prostate volume: 71.5 cc   Nomogram  OC disease: 64%  EPE: 33%  SVI: 5%  LNI: 7%  PFS (5 year, 10 year): 73%, 59%   Urinary function: IPSS is 7.  Erectile function: SHIM score is 25.     ALLERGIES: No Known Drug Allergies    MEDICATIONS: Allopurinol 300 mg tablet  Aspirin 81 mg tablet,chewable  Amlodipine Besylate 10 mg tablet 1 tablet PO Daily  Chlorthalidone 25 mg tablet 1 tablet PO Daily     GU PSH: Locm 300-399Mg /Ml Iodine,1Ml - 06/11/2021 Prostate Needle Biopsy - 04/04/2021     NON-GU PSH: Appendectomy (laparoscopic) Cholecystectomy (laparoscopic) Hernia Repair Surgical Pathology, Gross And Microscopic Examination For Prostate Needle - 04/04/2021     GU PMH: Elevated PSA - 06/11/2021, - 04/04/2021, - 02/12/2021 Prostate Cancer - 06/11/2021, - 06/11/2021, - 05/28/2021, - 05/21/2021, - 04/11/2021 BPH w/LUTS - 02/12/2021 Encounter for Prostate Cancer screening - 02/12/2021 Nocturia - 02/12/2021      PMH Notes: HTN   NON-GU PMH: Muscle weakness (generalized) - 06/11/2021, - 05/28/2021 Other muscle spasm - 06/11/2021, - 05/28/2021 Other specified disorders of muscle - 06/11/2021, - 05/28/2021 DVT, History Gout Pulmonary Embolism, History Sleep Apnea    FAMILY HISTORY: 1 Daughter - Other 1 son - Other Cancer - Mother Myocardial Infarction - Father    Notes: Father with CaP tx with surgery   SOCIAL HISTORY: Marital Status: Single Preferred  Language: [place]; Ethnicity: Not Hispanic Or Latino; Race: White Current Smoking Status: Patient has never smoked.   Tobacco Use Assessment Completed: Used Tobacco in last 30 days? Does not use smokeless tobacco. Social Drinker.  Does not use drugs. Drinks 2 caffeinated drinks per day. Has not had a blood transfusion.     Notes: ETOH beer 4-5 each day of weekend    REVIEW OF SYSTEMS:    GU  Review Male:   Patient denies frequent urination, hard to postpone urination, burning/ pain with urination, get up at night to urinate, leakage of urine, stream starts and stops, trouble starting your stream, have to strain to urinate , erection problems, and penile pain.  Gastrointestinal (Upper):   Patient denies nausea, vomiting, and indigestion/ heartburn.  Gastrointestinal (Lower):   Patient denies diarrhea and constipation.  Constitutional:   Patient denies fever, night sweats, weight loss, and fatigue.  Skin:   Patient denies skin rash/ lesion and itching.  Eyes:   Patient denies blurred vision and double vision.  Ears/ Nose/ Throat:   Patient denies sore throat and sinus problems.  Hematologic/Lymphatic:   Patient denies swollen glands and easy bruising.  Cardiovascular:   Patient denies leg swelling and chest pains.  Respiratory:   Patient denies shortness of breath and cough.  Endocrine:   Patient denies excessive thirst.  Musculoskeletal:   Patient denies back pain and joint pain.  Neurological:   Patient denies headaches and dizziness.  Psychologic:   Patient denies depression and anxiety.   VITAL SIGNS:      07/16/2021 01:32 PM  Weight 272 lb / 123.38 kg  Height 70 in / 177.8 cm  BP 153/97 mmHg  Pulse 97 /min  Temperature 97.5 F / 36.3 C  BMI 39.0 kg/m   MULTI-SYSTEM PHYSICAL EXAMINATION:    Constitutional: Well-nourished. No physical deformities. Normally developed. Good grooming.  Neck: Neck symmetrical, not swollen. Normal tracheal position.  Respiratory: Normal breath sounds. No labored breathing, no use of accessory muscles.   Cardiovascular: Regular rate and rhythm. No murmur, no gallop.   Lymphatic: No enlargement of neck, axillae, groin.  Skin: No paleness, no jaundice, no cyanosis. No lesion, no ulcer, no rash.  Neurologic / Psychiatric: Oriented to time, oriented to place, oriented to person. No depression, no anxiety, no agitation.  Gastrointestinal: No mass,  no tenderness, no rigidity, obese abdomen.   Eyes: Normal conjunctivae. Normal eyelids.  Ears, Nose, Mouth, and Throat: Left ear no scars, no lesions, no masses. Right ear no scars, no lesions, no masses. Nose no scars, no lesions, no masses. Normal hearing. Normal lips.  Musculoskeletal: Normal gait and station of head and neck.     Complexity of Data:  Records Review:   Previous Patient Records  Urine Test Review:   Urinalysis   07/16/21  Urinalysis  Urine Appearance Clear   Urine Color Straw   Urine Glucose Neg mg/dL  Urine Bilirubin Neg mg/dL  Urine Ketones Neg mg/dL  Urine Specific Gravity 1.015   Urine Blood Neg ery/uL  Urine pH 5.5   Urine Protein Neg mg/dL  Urine Urobilinogen 0.2 mg/dL  Urine Nitrites Neg   Urine Leukocyte Esterase Neg leu/uL   PROCEDURES:          Urinalysis - 81003 Dipstick Dipstick Cont'd  Color: Straw Bilirubin: Neg mg/dL  Appearance: Clear Ketones: Neg mg/dL  Specific Gravity: 1.015 Blood: Neg ery/uL  pH: 5.5 Protein: Neg mg/dL  Glucose: Neg mg/dL Urobilinogen: 0.2 mg/dL    Nitrites:  Neg    Leukocyte Esterase: Neg leu/uL    ASSESSMENT:      ICD-10 Details  1 GU:   Prostate Cancer - C61    PLAN:           Schedule Return Visit/Planned Activity: Keep Scheduled Appointment - Schedule Surgery          Document Letter(s):  Created for Patient: Clinical Summary         Notes:   There are no changes in the patients history or physical exam since last evaluation by Dr. Alinda Money. Pt is scheduled to undergo RALP with BPLND on 08/01/21.   All pt's questions were answered to the best of my ability.          Next Appointment:      Next Appointment: 08/01/2021 11:30 AM    Appointment Type: Surgery     Location: [facility] [phone]    Provider: Raynelle Bring, M.D.    Reason for Visit: [facility]/EXT REC RA LAP RAD PROSTATECTOMY LEVEL 2, BPLA WITH AMANDA      * Signed by Mcarthur Rossetti, PA on 07/16/21 at 1:52 PM  (EDT)*

## 2021-08-01 ENCOUNTER — Encounter (HOSPITAL_COMMUNITY)
Admission: RE | Disposition: A | Discharge status: Home / Self Care | Payer: Self-pay | Source: Home / Self Care | Attending: Urology

## 2021-08-01 ENCOUNTER — Ambulatory Visit (HOSPITAL_COMMUNITY): Payer: BC Managed Care – PPO | Admitting: Physician Assistant

## 2021-08-01 ENCOUNTER — Encounter (HOSPITAL_COMMUNITY): Payer: Self-pay | Admitting: Urology

## 2021-08-01 ENCOUNTER — Ambulatory Visit (HOSPITAL_COMMUNITY): Payer: BC Managed Care – PPO | Admitting: Anesthesiology

## 2021-08-01 ENCOUNTER — Other Ambulatory Visit: Payer: Self-pay

## 2021-08-01 ENCOUNTER — Observation Stay (HOSPITAL_COMMUNITY)
Admission: RE | Admit: 2021-08-01 | Discharge: 2021-08-02 | Disposition: A | Discharge status: Home / Self Care | Payer: BC Managed Care – PPO | Attending: Urology | Admitting: Urology

## 2021-08-01 DIAGNOSIS — Z79899 Other long term (current) drug therapy: Secondary | ICD-10-CM | POA: Diagnosis not present

## 2021-08-01 DIAGNOSIS — Z7982 Long term (current) use of aspirin: Secondary | ICD-10-CM | POA: Insufficient documentation

## 2021-08-01 DIAGNOSIS — C61 Malignant neoplasm of prostate: Secondary | ICD-10-CM | POA: Diagnosis not present

## 2021-08-01 DIAGNOSIS — Z7901 Long term (current) use of anticoagulants: Secondary | ICD-10-CM | POA: Insufficient documentation

## 2021-08-01 DIAGNOSIS — I739 Peripheral vascular disease, unspecified: Secondary | ICD-10-CM | POA: Diagnosis not present

## 2021-08-01 DIAGNOSIS — G4733 Obstructive sleep apnea (adult) (pediatric): Secondary | ICD-10-CM | POA: Diagnosis not present

## 2021-08-01 DIAGNOSIS — I10 Essential (primary) hypertension: Secondary | ICD-10-CM | POA: Diagnosis not present

## 2021-08-01 HISTORY — PX: LYMPHADENECTOMY: SHX5960

## 2021-08-01 HISTORY — PX: ROBOT ASSISTED LAPAROSCOPIC RADICAL PROSTATECTOMY: SHX5141

## 2021-08-01 LAB — ABO/RH: ABO/RH(D): B NEG

## 2021-08-01 LAB — HEMOGLOBIN AND HEMATOCRIT, BLOOD
HCT: 44.7 % (ref 39.0–52.0)
Hemoglobin: 15.2 g/dL (ref 13.0–17.0)

## 2021-08-01 LAB — TYPE AND SCREEN
ABO/RH(D): B NEG
Antibody Screen: NEGATIVE

## 2021-08-01 SURGERY — XI ROBOTIC ASSISTED LAPAROSCOPIC RADICAL PROSTATECTOMY LEVEL 2
Anesthesia: General | Site: Prostate

## 2021-08-01 MED ORDER — MIDAZOLAM HCL 2 MG/2ML IJ SOLN
INTRAMUSCULAR | Status: AC
  Filled 2021-08-01: qty 2

## 2021-08-01 MED ORDER — DOCUSATE SODIUM 100 MG PO CAPS
100.0000 mg | ORAL_CAPSULE | Freq: Two times a day (BID) | ORAL | Status: DC
  Administered 2021-08-01 – 2021-08-02 (×2): 100 mg via ORAL
  Filled 2021-08-01 (×3): qty 1

## 2021-08-01 MED ORDER — DIPHENHYDRAMINE HCL 50 MG/ML IJ SOLN: 12.5000 mg | Freq: Four times a day (QID) | INTRAMUSCULAR | Status: DC | PRN

## 2021-08-01 MED ORDER — BUPIVACAINE-EPINEPHRINE 0.25% -1:200000 IJ SOLN
INTRAMUSCULAR | Status: DC | PRN
  Administered 2021-08-01: 30 mL

## 2021-08-01 MED ORDER — PROMETHAZINE HCL 25 MG/ML IJ SOLN
6.2500 mg | INTRAMUSCULAR | Status: DC | PRN
  Administered 2021-08-01: 6.25 mg via INTRAVENOUS

## 2021-08-01 MED ORDER — BACITRACIN-NEOMYCIN-POLYMYXIN 400-5-5000 EX OINT: 1.0000 "application " | TOPICAL_OINTMENT | Freq: Three times a day (TID) | CUTANEOUS | Status: DC | PRN

## 2021-08-01 MED ORDER — DEXAMETHASONE SODIUM PHOSPHATE 10 MG/ML IJ SOLN
INTRAMUSCULAR | Status: DC | PRN
  Administered 2021-08-01: 10 mg via INTRAVENOUS

## 2021-08-01 MED ORDER — LACTATED RINGERS IV SOLN: INTRAVENOUS | Status: DC

## 2021-08-01 MED ORDER — DEXAMETHASONE SODIUM PHOSPHATE 10 MG/ML IJ SOLN
INTRAMUSCULAR | Status: AC
  Filled 2021-08-01: qty 1

## 2021-08-01 MED ORDER — ONDANSETRON HCL 4 MG/2ML IJ SOLN: 4.0000 mg | INTRAMUSCULAR | Status: DC | PRN

## 2021-08-01 MED ORDER — PROPOFOL 10 MG/ML IV BOLUS
INTRAVENOUS | Status: DC | PRN
  Administered 2021-08-01: 200 mg via INTRAVENOUS

## 2021-08-01 MED ORDER — SODIUM CHLORIDE (PF) 0.9 % IJ SOLN
INTRAMUSCULAR | Status: AC
  Filled 2021-08-01: qty 10

## 2021-08-01 MED ORDER — SUFENTANIL CITRATE 50 MCG/ML IV SOLN
INTRAVENOUS | Status: DC | PRN
  Administered 2021-08-01 (×5): 10 ug via INTRAVENOUS

## 2021-08-01 MED ORDER — FLEET ENEMA 7-19 GM/118ML RE ENEM: 1.0000 | ENEMA | Freq: Once | RECTAL | Status: DC

## 2021-08-01 MED ORDER — CEFAZOLIN IN SODIUM CHLORIDE 3-0.9 GM/100ML-% IV SOLN
3.0000 g | Freq: Once | INTRAVENOUS | Status: AC
Start: 2021-08-01 — End: 2021-08-01
  Administered 2021-08-01: 3 g via INTRAVENOUS
  Filled 2021-08-01: qty 100

## 2021-08-01 MED ORDER — KCL IN DEXTROSE-NACL 20-5-0.45 MEQ/L-%-% IV SOLN
INTRAVENOUS | Status: DC
  Filled 2021-08-01 (×3): qty 1000

## 2021-08-01 MED ORDER — POLYETHYLENE GLYCOL 3350 17 G PO PACK: 17.0000 g | PACK | Freq: Every day | ORAL | Status: DC

## 2021-08-01 MED ORDER — ONDANSETRON HCL 4 MG/2ML IJ SOLN
INTRAMUSCULAR | Status: AC
  Filled 2021-08-01: qty 2

## 2021-08-01 MED ORDER — SODIUM CHLORIDE 0.9 % IR SOLN
Status: DC | PRN
  Administered 2021-08-01: 1000 mL via INTRAVESICAL

## 2021-08-01 MED ORDER — ROCURONIUM BROMIDE 10 MG/ML (PF) SYRINGE
PREFILLED_SYRINGE | INTRAVENOUS | Status: DC | PRN
  Administered 2021-08-01: 10 mg via INTRAVENOUS
  Administered 2021-08-01: 20 mg via INTRAVENOUS
  Administered 2021-08-01 (×2): 10 mg via INTRAVENOUS
  Administered 2021-08-01: 20 mg via INTRAVENOUS
  Administered 2021-08-01: 60 mg via INTRAVENOUS

## 2021-08-01 MED ORDER — CHLORTHALIDONE 25 MG PO TABS
12.5000 mg | ORAL_TABLET | Freq: Every day | ORAL | Status: DC
  Administered 2021-08-01 – 2021-08-02 (×2): 12.5 mg via ORAL
  Filled 2021-08-01 (×2): qty 0.5

## 2021-08-01 MED ORDER — MIDAZOLAM HCL 2 MG/2ML IJ SOLN
INTRAMUSCULAR | Status: DC | PRN
  Administered 2021-08-01: 2 mg via INTRAVENOUS

## 2021-08-01 MED ORDER — AMLODIPINE BESYLATE 10 MG PO TABS
10.0000 mg | ORAL_TABLET | Freq: Every day | ORAL | Status: DC
  Administered 2021-08-01 – 2021-08-02 (×2): 10 mg via ORAL
  Filled 2021-08-01 (×2): qty 1

## 2021-08-01 MED ORDER — DOCUSATE SODIUM 100 MG PO CAPS: 100.0000 mg | ORAL_CAPSULE | Freq: Two times a day (BID) | ORAL | Status: DC

## 2021-08-01 MED ORDER — SUGAMMADEX SODIUM 500 MG/5ML IV SOLN
INTRAVENOUS | Status: DC | PRN
  Administered 2021-08-01: 300 mg via INTRAVENOUS

## 2021-08-01 MED ORDER — HYDROMORPHONE HCL 1 MG/ML IJ SOLN
0.2500 mg | INTRAMUSCULAR | Status: DC | PRN
  Administered 2021-08-01 (×2): 0.5 mg via INTRAVENOUS

## 2021-08-01 MED ORDER — LACTATED RINGERS IV SOLN
INTRAVENOUS | Status: DC | PRN
  Administered 2021-08-01: 1001 mL

## 2021-08-01 MED ORDER — HYDROMORPHONE HCL 2 MG/ML IJ SOLN
INTRAMUSCULAR | Status: AC
  Filled 2021-08-01: qty 1

## 2021-08-01 MED ORDER — HEPARIN SODIUM (PORCINE) 1000 UNIT/ML IJ SOLN
INTRAMUSCULAR | Status: AC
  Filled 2021-08-01: qty 1

## 2021-08-01 MED ORDER — BUPIVACAINE-EPINEPHRINE (PF) 0.25% -1:200000 IJ SOLN
INTRAMUSCULAR | Status: AC
  Filled 2021-08-01: qty 30

## 2021-08-01 MED ORDER — STERILE WATER FOR IRRIGATION IR SOLN
Status: DC | PRN
  Administered 2021-08-01: 1000 mL

## 2021-08-01 MED ORDER — BELLADONNA ALKALOIDS-OPIUM 16.2-60 MG RE SUPP: 1.0000 | Freq: Four times a day (QID) | RECTAL | Status: DC | PRN

## 2021-08-01 MED ORDER — PROPOFOL 10 MG/ML IV BOLUS
INTRAVENOUS | Status: AC
  Filled 2021-08-01: qty 20

## 2021-08-01 MED ORDER — ACETAMINOPHEN 325 MG PO TABS: 650.0000 mg | ORAL_TABLET | ORAL | Status: DC | PRN

## 2021-08-01 MED ORDER — HYDROMORPHONE HCL 1 MG/ML IJ SOLN
INTRAMUSCULAR | Status: AC
  Filled 2021-08-01: qty 1

## 2021-08-01 MED ORDER — ORAL CARE MOUTH RINSE: 15.0000 mL | Freq: Once | OROMUCOSAL | Status: AC

## 2021-08-01 MED ORDER — SUGAMMADEX SODIUM 500 MG/5ML IV SOLN
INTRAVENOUS | Status: AC
  Filled 2021-08-01: qty 5

## 2021-08-01 MED ORDER — PROMETHAZINE HCL 25 MG/ML IJ SOLN
INTRAMUSCULAR | Status: AC
  Filled 2021-08-01: qty 1

## 2021-08-01 MED ORDER — ZOLPIDEM TARTRATE 5 MG PO TABS: 5.0000 mg | ORAL_TABLET | Freq: Every evening | ORAL | Status: DC | PRN

## 2021-08-01 MED ORDER — DIPHENHYDRAMINE HCL 12.5 MG/5ML PO ELIX: 12.5000 mg | ORAL_SOLUTION | Freq: Four times a day (QID) | ORAL | Status: DC | PRN

## 2021-08-01 MED ORDER — SUGAMMADEX SODIUM 500 MG/5ML IV SOLN: INTRAVENOUS | Status: DC | PRN

## 2021-08-01 MED ORDER — LIDOCAINE HCL (PF) 2 % IJ SOLN
INTRAMUSCULAR | Status: AC
  Filled 2021-08-01: qty 5

## 2021-08-01 MED ORDER — ALLOPURINOL 300 MG PO TABS
300.0000 mg | ORAL_TABLET | Freq: Every day | ORAL | Status: DC
  Administered 2021-08-01 – 2021-08-02 (×2): 300 mg via ORAL
  Filled 2021-08-01 (×2): qty 1

## 2021-08-01 MED ORDER — HYDROMORPHONE HCL 1 MG/ML IJ SOLN
INTRAMUSCULAR | Status: DC | PRN
  Administered 2021-08-01 (×4): .5 mg via INTRAVENOUS

## 2021-08-01 MED ORDER — ONDANSETRON HCL 4 MG/2ML IJ SOLN
INTRAMUSCULAR | Status: DC | PRN
  Administered 2021-08-01: 4 mg via INTRAVENOUS

## 2021-08-01 MED ORDER — SULFAMETHOXAZOLE-TRIMETHOPRIM 800-160 MG PO TABS: 1.0000 | ORAL_TABLET | Freq: Two times a day (BID) | ORAL | 0 refills | Status: DC

## 2021-08-01 MED ORDER — SUFENTANIL CITRATE 50 MCG/ML IV SOLN
INTRAVENOUS | Status: AC
  Filled 2021-08-01: qty 1

## 2021-08-01 MED ORDER — SODIUM CHLORIDE 0.9 % IV BOLUS
1000.0000 mL | Freq: Once | INTRAVENOUS | Status: AC
  Administered 2021-08-01: 1000 mL via INTRAVENOUS

## 2021-08-01 MED ORDER — LIDOCAINE 2% (20 MG/ML) 5 ML SYRINGE
INTRAMUSCULAR | Status: DC | PRN
Start: 2021-08-01 — End: 2021-08-01
  Administered 2021-08-01: 100 mg via INTRAVENOUS

## 2021-08-01 MED ORDER — ROCURONIUM BROMIDE 10 MG/ML (PF) SYRINGE
PREFILLED_SYRINGE | INTRAVENOUS | Status: AC
  Filled 2021-08-01: qty 10

## 2021-08-01 MED ORDER — KETOROLAC TROMETHAMINE 15 MG/ML IJ SOLN
15.0000 mg | Freq: Four times a day (QID) | INTRAMUSCULAR | Status: DC
  Administered 2021-08-01 – 2021-08-02 (×4): 15 mg via INTRAVENOUS
  Filled 2021-08-01 (×4): qty 1

## 2021-08-01 MED ORDER — CHLORHEXIDINE GLUCONATE 0.12 % MT SOLN
15.0000 mL | Freq: Once | OROMUCOSAL | Status: AC
  Administered 2021-08-01: 15 mL via OROMUCOSAL

## 2021-08-01 MED ORDER — CHLORHEXIDINE GLUCONATE CLOTH 2 % EX PADS
6.0000 | MEDICATED_PAD | Freq: Every day | CUTANEOUS | Status: DC
  Administered 2021-08-02: 6 via TOPICAL

## 2021-08-01 MED ORDER — MORPHINE SULFATE (PF) 2 MG/ML IV SOLN: 2.0000 mg | INTRAVENOUS | Status: DC | PRN

## 2021-08-01 MED ORDER — HEPARIN SODIUM (PORCINE) 5000 UNIT/ML IJ SOLN
5000.0000 [IU] | Freq: Three times a day (TID) | INTRAMUSCULAR | Status: DC
  Administered 2021-08-01 – 2021-08-02 (×3): 5000 [IU] via SUBCUTANEOUS
  Filled 2021-08-01 (×3): qty 1

## 2021-08-01 MED ORDER — ACETAMINOPHEN 500 MG PO TABS
1000.0000 mg | ORAL_TABLET | Freq: Once | ORAL | Status: AC
  Administered 2021-08-01: 1000 mg via ORAL
  Filled 2021-08-01: qty 2

## 2021-08-01 MED ORDER — CEFAZOLIN SODIUM-DEXTROSE 1-4 GM/50ML-% IV SOLN
1.0000 g | Freq: Three times a day (TID) | INTRAVENOUS | Status: AC
  Administered 2021-08-01 – 2021-08-02 (×2): 1 g via INTRAVENOUS
  Filled 2021-08-01 (×2): qty 50

## 2021-08-01 MED ORDER — TRAMADOL HCL 50 MG PO TABS: 50.0000 mg | ORAL_TABLET | Freq: Four times a day (QID) | ORAL | 0 refills | Status: DC | PRN

## 2021-08-01 SURGICAL SUPPLY — 72 items
ADH SKN CLS APL DERMABOND .7 (GAUZE/BANDAGES/DRESSINGS) ×2
APL PRP STRL LF DISP 70% ISPRP (MISCELLANEOUS) ×2
APL SWBSTK 6 STRL LF DISP (MISCELLANEOUS) ×2
APPLICATOR COTTON TIP 6 STRL (MISCELLANEOUS) ×2 IMPLANT
APPLICATOR COTTON TIP 6IN STRL (MISCELLANEOUS) ×3
BAG COUNTER SPONGE SURGICOUNT (BAG) IMPLANT
BAG SPNG CNTER NS LX DISP (BAG)
CATH FOLEY 2WAY SLVR 18FR 30CC (CATHETERS) ×3 IMPLANT
CATH ROBINSON RED A/P 16FR (CATHETERS) ×2 IMPLANT
CATH ROBINSON RED A/P 18FR (CATHETERS) ×1 IMPLANT
CATH ROBINSON RED A/P 8FR (CATHETERS) ×3 IMPLANT
CATH TIEMANN FOLEY 18FR 5CC (CATHETERS) ×3 IMPLANT
CHLORAPREP W/TINT 26 (MISCELLANEOUS) ×3 IMPLANT
CLIP LIGATING HEM O LOK PURPLE (MISCELLANEOUS) ×6 IMPLANT
COVER SURGICAL LIGHT HANDLE (MISCELLANEOUS) ×3 IMPLANT
COVER TIP SHEARS 8 DVNC (MISCELLANEOUS) ×2 IMPLANT
COVER TIP SHEARS 8MM DA VINCI (MISCELLANEOUS) ×3
CUTTER ECHEON FLEX ENDO 45 340 (ENDOMECHANICALS) ×3 IMPLANT
DECANTER SPIKE VIAL GLASS SM (MISCELLANEOUS) ×3 IMPLANT
DERMABOND ADVANCED (GAUZE/BANDAGES/DRESSINGS) ×1
DERMABOND ADVANCED .7 DNX12 (GAUZE/BANDAGES/DRESSINGS) ×2 IMPLANT
DRAIN CHANNEL RND F F (WOUND CARE) IMPLANT
DRAPE ARM DVNC X/XI (DISPOSABLE) ×8 IMPLANT
DRAPE COLUMN DVNC XI (DISPOSABLE) ×2 IMPLANT
DRAPE DA VINCI XI ARM (DISPOSABLE) ×12
DRAPE DA VINCI XI COLUMN (DISPOSABLE) ×3
DRAPE SURG IRRIG POUCH 19X23 (DRAPES) ×3 IMPLANT
DRSG TEGADERM 4X4.75 (GAUZE/BANDAGES/DRESSINGS) ×3 IMPLANT
ELECT PENCIL ROCKER SW 15FT (MISCELLANEOUS) ×3 IMPLANT
ELECT REM PT RETURN 15FT ADLT (MISCELLANEOUS) ×3 IMPLANT
GAUZE 4X4 16PLY ~~LOC~~+RFID DBL (SPONGE) ×3 IMPLANT
GAUZE SPONGE 4X4 12PLY STRL (GAUZE/BANDAGES/DRESSINGS) ×3 IMPLANT
GLOVE SURG ENC MOIS LTX SZ6.5 (GLOVE) ×4 IMPLANT
GLOVE SURG ENC TEXT LTX SZ7.5 (GLOVE) ×6 IMPLANT
GLOVE SURG POLY ORTHO LF SZ7 (GLOVE) ×1 IMPLANT
GLOVE SURG UNDER POLY LF SZ6.5 (GLOVE) ×1 IMPLANT
GOWN STRL REUS W/TWL LRG LVL3 (GOWN DISPOSABLE) ×10 IMPLANT
HOLDER FOLEY CATH W/STRAP (MISCELLANEOUS) ×3 IMPLANT
IRRIG SUCT STRYKERFLOW 2 WTIP (MISCELLANEOUS) ×3
IRRIGATION SUCT STRKRFLW 2 WTP (MISCELLANEOUS) ×2 IMPLANT
IV LACTATED RINGERS 1000ML (IV SOLUTION) ×3 IMPLANT
IV NS 1000ML (IV SOLUTION) ×3
IV NS 1000ML BAXH (IV SOLUTION) IMPLANT
NDL SAFETY ECLIPSE 18X1.5 (NEEDLE) ×2 IMPLANT
NEEDLE HYPO 18GX1.5 SHARP (NEEDLE) ×3
PACK ROBOT UROLOGY CUSTOM (CUSTOM PROCEDURE TRAY) ×3 IMPLANT
PENCIL SMOKE EVACUATOR (MISCELLANEOUS) IMPLANT
RELOAD STAPLE 45 4.1 GRN THCK (STAPLE) ×2 IMPLANT
SEAL CANN UNIV 5-8 DVNC XI (MISCELLANEOUS) ×8 IMPLANT
SEAL XI 5MM-8MM UNIVERSAL (MISCELLANEOUS) ×12
SET TUBE SMOKE EVAC HIGH FLOW (TUBING) ×3 IMPLANT
SOLUTION ELECTROLUBE (MISCELLANEOUS) ×3 IMPLANT
STAPLE RELOAD 45 GRN (STAPLE) ×2 IMPLANT
STAPLE RELOAD 45MM GREEN (STAPLE) ×3
SUT ETHILON 3 0 PS 1 (SUTURE) ×3 IMPLANT
SUT MNCRL 3 0 RB1 (SUTURE) ×2 IMPLANT
SUT MNCRL 3 0 VIOLET RB1 (SUTURE) ×2 IMPLANT
SUT MNCRL AB 4-0 PS2 18 (SUTURE) ×6 IMPLANT
SUT MONOCRYL 3 0 RB1 (SUTURE) ×6
SUT PDS PLUS 0 (SUTURE) ×6
SUT PDS PLUS AB 0 CT-2 (SUTURE) ×4 IMPLANT
SUT VIC AB 0 CT1 27 (SUTURE) ×3
SUT VIC AB 0 CT1 27XBRD ANTBC (SUTURE) ×2 IMPLANT
SUT VIC AB 0 UR5 27 (SUTURE) ×3 IMPLANT
SUT VIC AB 2-0 SH 27 (SUTURE) ×3
SUT VIC AB 2-0 SH 27X BRD (SUTURE) ×2 IMPLANT
SUT VIC AB 3-0 SH 27 (SUTURE) ×3
SUT VIC AB 3-0 SH 27XBRD (SUTURE) IMPLANT
SYR 27GX1/2 1ML LL SAFETY (SYRINGE) ×3 IMPLANT
TOWEL OR NON WOVEN STRL DISP B (DISPOSABLE) ×3 IMPLANT
TROCAR XCEL NON-BLD 5MMX100MML (ENDOMECHANICALS) IMPLANT
WATER STERILE IRR 1000ML POUR (IV SOLUTION) ×3 IMPLANT

## 2021-08-01 NOTE — Anesthesia Preprocedure Evaluation (Addendum)
Anesthesia Evaluation  Patient identified by MRN, date of birth, ID band Patient awake    Reviewed: Allergy & Precautions, NPO status , Patient's Chart, lab work & pertinent test results  History of Anesthesia Complications Negative for: history of anesthetic complications  Airway Mallampati: II  TM Distance: >3 FB Neck ROM: Full    Dental no notable dental hx.    Pulmonary sleep apnea , PE (2017)   Pulmonary exam normal        Cardiovascular hypertension, Pt. on medications + Peripheral Vascular Disease  Normal cardiovascular exam     Neuro/Psych negative neurological ROS  negative psych ROS   GI/Hepatic negative GI ROS, Neg liver ROS,   Endo/Other  Morbid obesity  Renal/GU negative Renal ROS  negative genitourinary   Musculoskeletal negative musculoskeletal ROS (+)   Abdominal   Peds  Hematology negative hematology ROS (+)   Anesthesia Other Findings Prostate ca   Reproductive/Obstetrics negative OB ROS                            Anesthesia Physical Anesthesia Plan  ASA: 2  Anesthesia Plan: General   Post-op Pain Management:    Induction: Intravenous  PONV Risk Score and Plan: 4 or greater and Treatment may vary due to age or medical condition, Ondansetron, Dexamethasone and Midazolam  Airway Management Planned: Oral ETT  Additional Equipment: None  Intra-op Plan:   Post-operative Plan: Extubation in OR  Informed Consent: I have reviewed the patients History and Physical, chart, labs and discussed the procedure including the risks, benefits and alternatives for the proposed anesthesia with the patient or authorized representative who has indicated his/her understanding and acceptance.     Dental advisory given  Plan Discussed with: CRNA  Anesthesia Plan Comments:        Anesthesia Quick Evaluation

## 2021-08-01 NOTE — Anesthesia Postprocedure Evaluation (Signed)
Anesthesia Post Note  Patient: Jonathon [name]  Procedure(s) Performed: XI ROBOTIC ASSISTED LAPAROSCOPIC RADICAL PROSTATECTOMY LEVEL 2 (Prostate) LYMPHADENECTOMY, PELVIC (Bilateral: Pelvis)     Patient location during evaluation: PACU Anesthesia Type: General Level of consciousness: awake and alert and oriented Pain management: pain level controlled Vital Signs Assessment: post-procedure vital signs reviewed and stable Respiratory status: spontaneous breathing, nonlabored ventilation and respiratory function stable Cardiovascular status: blood pressure returned to baseline Postop Assessment: no apparent nausea or vomiting Anesthetic complications: no   No notable events documented.  Last Vitals:  Vitals:   08/01/21 1500 08/01/21 1520  BP: (!) 161/98 (!) 157/96  Pulse: (!) 103 (!) 106  Resp: 16 16  Temp: 37.1 C 37 C  SpO2: 92% 93%    Last Pain:  Vitals:   08/01/21 1520  TempSrc: Oral  PainSc:                  Marthenia Rolling

## 2021-08-01 NOTE — Progress Notes (Signed)
Patient ID: Jonathon [name], male   DOB: 1966-07-02, 55 y.o.   MRN: [id number]  Post-op note  Subjective: The patient is doing well.  No complaints.  Objective: Vital signs in last 24 hours: Temp:  [98.3 F (36.8 C)-98.8 F (37.1 C)] 98.6 F (37 C) (10/20 1520) Pulse Rate:  [93-106] 106 (10/20 1520) Resp:  [13-17] 16 (10/20 1520) BP: (144-200)/(90-123) 157/96 (10/20 1520) SpO2:  [91 %-100 %] 93 % (10/20 1520) Weight:  [126.7 kg-127 kg] 126.7 kg (10/20 1520)  Intake/Output from previous day: No intake/output data recorded. Intake/Output this shift: Total I/O In: 3150 [I.V.:2050; IV Piggyback:1100] Out: 475 [Urine:225; Drains:50; Blood:200]  Physical Exam:  General: Alert and oriented. Abdomen: Soft, Nondistended. Incisions: Clean and dry.  Lab Results: Recent Labs    08/01/21 1431  HGB 15.2  HCT 44.7    Assessment/Plan: POD#0   1) Continue to monitor, ambulate, IS, DVT prophylaxis   Jonathon [name]. MD   LOS: 0 days   Jonathon [name] 08/01/2021, 3:51 PM

## 2021-08-01 NOTE — Op Note (Signed)
Preoperative diagnosis: Clinically localized adenocarcinoma of the prostate (clinical stage T1c N0 M0)  Postoperative diagnosis: Clinically localized adenocarcinoma of the prostate (clinical stage T1c N0 M0)  Procedure:  Robotic assisted laparoscopic radical prostatectomy (bilateral nerve sparing) Bilateral robotic assisted laparoscopic pelvic lymphadenectomy  Surgeon: Pryor Curia. M.D.  Assistant: Debbrah Alar, PA-C  An assistant was required for this surgical procedure.  The duties of the assistant included but were not limited to suctioning, passing suture, camera manipulation, retraction. This procedure would not be able to be performed without an Environmental consultant.  Anesthesia: General  Complications: None  EBL: 200 mL  IVF:  2000 mL crystalloid  Specimens: Prostate and seminal vesicles Right pelvic lymph nodes Left pelvic lymph nodes  Disposition of specimens: Pathology  Drains: 20 Fr coude catheter # 19 Blake pelvic drain  Indication: Jonathon [name] is a 55 y.o. year old patient with clinically localized prostate cancer.  After a thorough review of the management options for treatment of prostate cancer, he elected to proceed with surgical therapy and the above procedure(s).  We have discussed the potential benefits and risks of the procedure, side effects of the proposed treatment, the likelihood of the patient achieving the goals of the procedure, and any potential problems that might occur during the procedure or recuperation. Informed consent has been obtained.  Description of procedure:  The patient was taken to the operating room and a general anesthetic was administered. He was given preoperative antibiotics, placed in the dorsal lithotomy position, and prepped and draped in the usual sterile fashion. Next a preoperative timeout was performed. A urethral catheter was placed into the bladder and a site was selected near the umbilicus for placement of the  camera port. This was placed using a standard open Hassan technique which allowed entry into the peritoneal cavity under direct vision and without difficulty. An 8 mm robotic port was placed and a pneumoperitoneum established. The camera was then used to inspect the abdomen and there was no evidence of any intra-abdominal injuries or other abnormalities. The remaining abdominal ports were then placed. 8 mm robotic ports were placed in the right lower quadrant, left lower quadrant, and far left lateral abdominal wall. A 5 mm port was placed in the right upper quadrant and a 12 mm port was placed in the right lateral abdominal wall for laparoscopic assistance. All ports were placed under direct vision without difficulty. The surgical cart was then docked.   Utilizing the cautery scissors, the bladder was reflected posteriorly allowing entry into the space of Retzius and identification of the endopelvic fascia and prostate. The periprostatic fat was then removed from the prostate allowing full exposure of the endopelvic fascia. The endopelvic fascia was then incised from the apex back to the base of the prostate bilaterally and the underlying levator muscle fibers were swept laterally off the prostate thereby isolating the dorsal venous complex. The dorsal vein was then stapled and divided with a 45 mm Flex Echelon stapler. Attention then turned to the bladder neck which was divided anteriorly thereby allowing entry into the bladder and exposure of the urethral catheter. The catheter balloon was deflated and the catheter was brought into the operative field and used to retract the prostate anteriorly. The posterior bladder neck was then examined and was divided allowing further dissection between the bladder and prostate posteriorly until the vasa deferentia and seminal vessels were identified. The vasa deferentia were isolated, divided, and lifted anteriorly. The seminal vesicles were dissected down to  their tips  with care to control the seminal vascular arterial blood supply. These structures were then lifted anteriorly and the space between Denonvillier's fascia and the anterior rectum was developed with a combination of sharp and blunt dissection. This isolated the vascular pedicles of the prostate.  The lateral prostatic fascia was then sharply incised allowing release of the neurovascular bundles bilaterally. The vascular pedicles of the prostate were then ligated with Weck clips between the prostate and neurovascular bundles and divided with sharp cold scissor dissection resulting in neurovascular bundle preservation. The neurovascular bundles were then separated off the apex of the prostate and urethra bilaterally.  The urethra was then sharply transected allowing the prostate specimen to be disarticulated. The pelvis was copiously irrigated and hemostasis was ensured. There was no evidence for rectal injury.  Attention then turned to the right pelvic sidewall. The fibrofatty tissue between the external iliac vein, confluence of the iliac vessels, hypogastric artery, and Cooper's ligament was dissected free from the pelvic sidewall with care to preserve the obturator nerve. Weck clips were used for lymphostasis and hemostasis. An identical procedure was performed on the contralateral side and the lymphatic packets were removed for permanent pathologic analysis.  Attention then turned to the urethral anastomosis. A 2-0 Vicryl slip knot was placed between Denonvillier's fascia, the posterior bladder neck, and the posterior urethra to reapproximate these structures. A double-armed 3-0 Monocryl suture was then used to perform a 360 running tension-free anastomosis between the bladder neck and urethra. A new urethral catheter was then placed into the bladder and irrigated. There were no blood clots within the bladder and the anastomosis appeared to be watertight. A #19 Blake drain was then brought through the left  lateral 8 mm port site and positioned appropriately within the pelvis. It was secured to the skin with a nylon suture. The surgical cart was then undocked. The right lateral 12 mm port site was closed at the fascial level with a 0 Vicryl suture placed laparoscopically. All remaining ports were then removed under direct vision. The prostate specimen was removed intact within the Endopouch retrieval bag via the periumbilical camera port site. This fascial opening was closed with two running 0 PDS sutures. 0.25% Marcaine was then injected into all port sites and all incisions were reapproximated at the skin level with 4-0 Monocryl subcuticular sutures and Dermabond. The patient appeared to tolerate the procedure well and without complications. The patient was able to be extubated and transferred to the recovery unit in satisfactory condition.   Pryor Curia MD

## 2021-08-01 NOTE — Transfer of Care (Signed)
Immediate Anesthesia Transfer of Care Note  Patient: Jonathon [name]  Procedure(s) Performed: XI ROBOTIC ASSISTED LAPAROSCOPIC RADICAL PROSTATECTOMY LEVEL 2 (Prostate) LYMPHADENECTOMY, PELVIC (Bilateral: Pelvis)  Patient Location: PACU  Anesthesia Type:General  Level of Consciousness: awake and alert   Airway & Oxygen Therapy: Patient Spontanous Breathing and Patient connected to face mask oxygen  Post-op Assessment: Report given to RN and Post -op Vital signs reviewed and stable  Post vital signs: Reviewed and stable  Last Vitals:  Vitals Value Taken Time  BP    Temp    Pulse 94 08/01/21 1408  Resp 13 08/01/21 1408  SpO2 100 % 08/01/21 1408  Vitals shown include unvalidated device data.  Last Pain:  Vitals:   08/01/21 0955  TempSrc:   PainSc: 0-No pain         Complications: No notable events documented.

## 2021-08-01 NOTE — Interval H&P Note (Signed)
History and Physical Interval Note:  08/01/2021 10:26 AM  Jonathon [name]  has presented today for surgery, with the diagnosis of PROSTATE CANCER.  The various methods of treatment have been discussed with the patient and family. After consideration of risks, benefits and other options for treatment, the patient has consented to  Procedure(s): XI ROBOTIC ASSISTED LAPAROSCOPIC RADICAL PROSTATECTOMY LEVEL 2 (N/A) LYMPHADENECTOMY, PELVIC (Bilateral) as a surgical intervention.  The patient's history has been reviewed, patient examined, no change in status, stable for surgery.  I have reviewed the patient's chart and labs.  Questions were answered to the patient's satisfaction.     [employer]

## 2021-08-01 NOTE — Discharge Instructions (Signed)

## 2021-08-01 NOTE — Anesthesia Procedure Notes (Addendum)
Procedure Name: Intubation Date/Time: 08/01/2021 11:18 AM Performed by: Sharlette Dense, CRNA Pre-anesthesia Checklist: Patient identified, Emergency Drugs available, Suction available and Patient being monitored Patient Re-evaluated:Patient Re-evaluated prior to induction Oxygen Delivery Method: Circle system utilized Preoxygenation: Pre-oxygenation with 100% oxygen Induction Type: IV induction Ventilation: Mask ventilation without difficulty and Oral airway inserted - appropriate to patient size Laryngoscope Size: Miller and 3 Grade View: Grade II Tube type: Oral Tube size: 8.0 mm Number of attempts: 1 Airway Equipment and Method: Stylet Placement Confirmation: ETT inserted through vocal cords under direct vision, positive ETCO2 and breath sounds checked- equal and bilateral Secured at: 23 cm Tube secured with: Tape Dental Injury: Teeth and Oropharynx as per pre-operative assessment

## 2021-08-02 ENCOUNTER — Encounter (HOSPITAL_COMMUNITY): Payer: Self-pay | Admitting: Urology

## 2021-08-02 DIAGNOSIS — Z7901 Long term (current) use of anticoagulants: Secondary | ICD-10-CM | POA: Diagnosis not present

## 2021-08-02 DIAGNOSIS — Z79899 Other long term (current) drug therapy: Secondary | ICD-10-CM | POA: Diagnosis not present

## 2021-08-02 DIAGNOSIS — I10 Essential (primary) hypertension: Secondary | ICD-10-CM | POA: Diagnosis not present

## 2021-08-02 DIAGNOSIS — C61 Malignant neoplasm of prostate: Secondary | ICD-10-CM | POA: Diagnosis not present

## 2021-08-02 DIAGNOSIS — Z7982 Long term (current) use of aspirin: Secondary | ICD-10-CM | POA: Diagnosis not present

## 2021-08-02 LAB — HEMOGLOBIN AND HEMATOCRIT, BLOOD
HCT: 41.2 % (ref 39.0–52.0)
Hemoglobin: 14 g/dL (ref 13.0–17.0)

## 2021-08-02 MED ORDER — TRAMADOL HCL 50 MG PO TABS: 50.0000 mg | ORAL_TABLET | Freq: Four times a day (QID) | ORAL | Status: DC | PRN

## 2021-08-02 MED ORDER — BISACODYL 10 MG RE SUPP
10.0000 mg | Freq: Once | RECTAL | Status: AC
  Administered 2021-08-02: 10 mg via RECTAL
  Filled 2021-08-02: qty 1

## 2021-08-02 NOTE — Progress Notes (Signed)
AVS given to patient and explained at the bedside. Medications and follow up appointments have been explained with pt verbalizing understanding.  

## 2021-08-02 NOTE — Progress Notes (Signed)
Patient ID: Jonathon [name], male   DOB: [birth year], 55 y.o.   MRN: [id number]  1 Day Post-Op Subjective: The patient is doing well.  No nausea or vomiting. Pain is adequately controlled.  Objective: Vital signs in last 24 hours: Temp:  [98.2 F (36.8 C)-99 F (37.2 C)] 98.2 F (36.8 C) (10/21 0505) Pulse Rate:  [85-106] 85 (10/21 0505) Resp:  [13-18] 18 (10/21 0505) BP: (140-200)/(89-123) 142/91 (10/21 0505) SpO2:  [91 %-100 %] 96 % (10/21 0505) Weight:  [126.7 kg-127 kg] 126.7 kg (10/20 1520)  Intake/Output from previous day: 10/20 0701 - 10/21 0700 In: 5152.9 [I.V.:3952.9; IV Piggyback:1200] Out: 2775 [Urine:2425; Drains:150; Blood:200] Intake/Output this shift: No intake/output data recorded.  Physical Exam:  General: Alert and oriented. CV: RRR Lungs: Clear bilaterally. GI: Soft, Nondistended. Incisions: Clean, dry, and intact Urine: Clear Extremities: Nontender, no erythema, no edema.  Lab Results: Recent Labs    08/01/21 1431 08/02/21 0333  HGB 15.2 14.0  HCT 44.7 41.2      Assessment/Plan: POD# 1 s/p robotic prostatectomy.  1) SL IVF 2) Ambulate, Incentive spirometry 3) Transition to oral pain medication 4) Dulcolax suppository 5) D/C pelvic drain 6) Plan for likely discharge later today   Jonathon [name]. MD   LOS: 0 days   Jonathon [name] 08/02/2021, 7:17 AM

## 2021-08-02 NOTE — Discharge Summary (Signed)
  Date of admission: 08/01/2021  Date of discharge: 08/02/2021  Admission diagnosis: Prostate Cancer  Discharge diagnosis: Prostate Cancer  History and Physical: For full details, please see admission history and physical. Briefly, Jonathon [name] is a 55 y.o. gentleman with localized prostate cancer.  After discussing management/treatment options, he elected to proceed with surgical treatment.  Hospital Course: [name] was taken to the operating room on 08/01/2021 and underwent a robotic assisted laparoscopic radical prostatectomy. He tolerated this procedure well and without complications. Postoperatively, he was able to be transferred to a regular hospital room following recovery from anesthesia.  He was able to begin ambulating the night of surgery. He remained hemodynamically stable overnight.  He had excellent urine output with appropriately minimal output from his pelvic drain and his pelvic drain was removed on POD #1.  He was transitioned to oral pain medication, tolerated a clear liquid diet, and had met all discharge criteria and was able to be discharged home later on POD#1.  Laboratory values:  Recent Labs    08/01/21 1431 08/02/21 0333  HGB 15.2 14.0  HCT 44.7 41.2    Disposition: Home  Discharge instruction: He was instructed to be ambulatory but to refrain from heavy lifting, strenuous activity, or driving. He was instructed on urethral catheter care.  Discharge medications:   Allergies as of 08/02/2021   No Known Allergies      Medication List     STOP taking these medications    aspirin EC 81 MG tablet   B-12 PO   COQ-10 PO   FISH OIL PO   GINKGO BILOBA PO   TURMERIC PO       TAKE these medications    allopurinol 300 MG tablet Commonly known as: ZYLOPRIM Take 1 tablet (300 mg total) by mouth daily.   amLODipine 10 MG tablet Commonly known as: NORVASC Take 1 tablet (10 mg total) by mouth daily.   chlorthalidone 25 MG  tablet Commonly known as: HYGROTON Take 0.5 tablets (12.5 mg total) by mouth daily.   docusate sodium 100 MG capsule Commonly known as: COLACE Take 1 capsule (100 mg total) by mouth 2 (two) times daily.   mupirocin ointment 2 % Commonly known as: BACTROBAN APPLY TWICE DAILY AS NEEDED TO BELLY [facility] What changed: See the new instructions.   sulfamethoxazole-trimethoprim 800-160 MG tablet Commonly known as: BACTRIM DS Take 1 tablet by mouth 2 (two) times daily. Start the day prior to foley removal appointment   traMADol 50 MG tablet Commonly known as: Ultram Take 1-2 tablets (50-100 mg total) by mouth every 6 (six) hours as needed for moderate pain or severe pain.        Followup: He will followup in 1 week for catheter removal and to discuss his surgical pathology results.

## 2021-08-07 LAB — SURGICAL PATHOLOGY

## 2021-08-29 DIAGNOSIS — R351 Nocturia: Secondary | ICD-10-CM | POA: Diagnosis not present

## 2021-08-29 DIAGNOSIS — M62838 Other muscle spasm: Secondary | ICD-10-CM | POA: Diagnosis not present

## 2021-08-29 DIAGNOSIS — M6281 Muscle weakness (generalized): Secondary | ICD-10-CM | POA: Diagnosis not present

## 2021-08-29 DIAGNOSIS — N393 Stress incontinence (female) (male): Secondary | ICD-10-CM | POA: Diagnosis not present

## 2021-09-12 ENCOUNTER — Other Ambulatory Visit: Payer: Self-pay | Admitting: *Deleted

## 2021-09-12 ENCOUNTER — Telehealth: Payer: Self-pay

## 2021-09-12 MED ORDER — CHLORTHALIDONE 25 MG PO TABS: 12.5000 mg | ORAL_TABLET | Freq: Every day | ORAL | 3 refills | Status: DC

## 2021-09-12 NOTE — Telephone Encounter (Signed)
LAST APPOINTMENT DATE:  06/18/21  NEXT APPOINTMENT DATE: 12/23/21  MEDICATION:chlorthalidone (HYGROTON) 25 MG tablet (Expired)  PHARMACY: [facility]  [address], [place], [place] [address]

## 2021-09-12 NOTE — Telephone Encounter (Signed)
May refill 90-day supply with 3 refills

## 2021-09-12 NOTE — Telephone Encounter (Signed)
Rx sent to the pharmacy.

## 2021-09-27 DIAGNOSIS — M6281 Muscle weakness (generalized): Secondary | ICD-10-CM | POA: Diagnosis not present

## 2021-09-27 DIAGNOSIS — R351 Nocturia: Secondary | ICD-10-CM | POA: Diagnosis not present

## 2021-09-27 DIAGNOSIS — M62838 Other muscle spasm: Secondary | ICD-10-CM | POA: Diagnosis not present

## 2021-09-27 DIAGNOSIS — N393 Stress incontinence (female) (male): Secondary | ICD-10-CM | POA: Diagnosis not present

## 2021-10-15 DIAGNOSIS — H5213 Myopia, bilateral: Secondary | ICD-10-CM | POA: Diagnosis not present

## 2021-10-15 DIAGNOSIS — H40043 Steroid responder, bilateral: Secondary | ICD-10-CM | POA: Diagnosis not present

## 2021-10-15 DIAGNOSIS — Z9889 Other specified postprocedural states: Secondary | ICD-10-CM | POA: Diagnosis not present

## 2021-10-15 DIAGNOSIS — H40051 Ocular hypertension, right eye: Secondary | ICD-10-CM | POA: Diagnosis not present

## 2021-10-23 DIAGNOSIS — C61 Malignant neoplasm of prostate: Secondary | ICD-10-CM | POA: Diagnosis not present

## 2021-11-01 DIAGNOSIS — N5201 Erectile dysfunction due to arterial insufficiency: Secondary | ICD-10-CM | POA: Diagnosis not present

## 2021-11-01 DIAGNOSIS — N393 Stress incontinence (female) (male): Secondary | ICD-10-CM | POA: Diagnosis not present

## 2021-11-01 DIAGNOSIS — C61 Malignant neoplasm of prostate: Secondary | ICD-10-CM | POA: Diagnosis not present

## 2021-11-07 ENCOUNTER — Ambulatory Visit (INDEPENDENT_AMBULATORY_CARE_PROVIDER_SITE_OTHER): Payer: BC Managed Care – PPO

## 2021-11-07 ENCOUNTER — Other Ambulatory Visit: Payer: Self-pay

## 2021-11-07 ENCOUNTER — Telehealth: Payer: Self-pay

## 2021-11-07 DIAGNOSIS — Z23 Encounter for immunization: Secondary | ICD-10-CM | POA: Diagnosis not present

## 2021-11-07 MED ORDER — RIVAROXABAN 20 MG PO TABS: 20.0000 mg | ORAL_TABLET | Freq: Every day | ORAL | 0 refills | Status: DC

## 2021-11-07 NOTE — Telephone Encounter (Signed)
Rx not currently on med list, please advise if appropriate request

## 2021-11-07 NOTE — Telephone Encounter (Signed)
Patient state due to a history of DVT's, Dr Yong Channel has prescribed him xerelto to take a few days prior to travel and after travel to take.    Patient would like a script sent to [facility] in [facility] for future travels.  Last OV:  06/18/21 Next OV:  12/23/21

## 2021-11-08 NOTE — Telephone Encounter (Signed)
Left pt a detailed voicemail that rx was sent in

## 2021-12-06 ENCOUNTER — Other Ambulatory Visit: Payer: Self-pay | Admitting: Family Medicine

## 2021-12-13 NOTE — Progress Notes (Signed)
Phone: [phone]   Subjective:  Patient presents today for their annual physical. Chief complaint-noted.   See problem oriented charting- ROS- full  review of systems was completed and negative  except for: some eye redness, leg swelling  The following were reviewed and entered/updated in epic: Past Medical History:  Diagnosis Date   Cancer ([place])    prostate   Chicken pox    Gout    Hypertension    Left leg DVT ([place]) 07/30/2016   Peripheral vascular disease ([place])    Pulmonary embolism ([place]) 07/18/2016   Patient Active Problem List   Diagnosis Date Noted   Malignant neoplasm of prostate ([place]) 04/23/2021    Priority: High   History of DVT (deep vein thrombosis) 07/30/2016    Priority: High   History of pulmonary embolism 07/18/2016    Priority: High   Gout 07/30/2016    Priority: Medium    OSA (obstructive sleep apnea) 07/30/2016    Priority: Medium    Essential hypertension 07/30/2016    Priority: Medium    Traumatic bursitis 11/12/2017    Priority: Low   Past Surgical History:  Procedure Laterality Date   APPENDECTOMY  2003   CHOLECYSTECTOMY  2002   HERNIA REPAIR     as child- unsure location   LASIK     similar to but not lasik per patient   LYMPHADENECTOMY Bilateral 08/01/2021   Procedure: LYMPHADENECTOMY, PELVIC;  Surgeon: Raynelle Bring, MD;  Location: [facility];  Service: Urology;  Laterality: Bilateral;   NASAL SEPTUM SURGERY  1996   ROBOT ASSISTED LAPAROSCOPIC RADICAL PROSTATECTOMY N/A 08/01/2021   Procedure: XI ROBOTIC ASSISTED LAPAROSCOPIC RADICAL PROSTATECTOMY LEVEL 2;  Surgeon: Raynelle Bring, MD;  Location[facility];  Service: Urology;  Laterality: N/A;   WISDOM TOOTH EXTRACTION      Family History  Problem Relation Age of Onset   Heart attack Father 25   Hypercalcemia Father    Diabetes Father    Hypertension Father    Uterine cancer Mother    Stroke Paternal Uncle    Alcohol abuse Paternal Grandfather    Other Sister        68 years older    Brain cancer Brother        glioblastoma- 53 years older   Other Brother        59 years older   Colon cancer Neg Hx    Colon polyps Neg Hx    Esophageal cancer Neg Hx    Rectal cancer Neg Hx    Stomach cancer Neg Hx     Medications- reviewed and updated Current Outpatient Medications  Medication Sig Dispense Refill   allopurinol (ZYLOPRIM) 300 MG tablet Take 1 tablet (300 mg total) by mouth daily. 90 tablet 3   mupirocin ointment (BACTROBAN) 2 % APPLY TWICE DAILY AS NEEDED TO BELLY BUTTON (Patient taking differently: Apply 1 application. topically 2 (two) times daily as needed (belly button infection).) 22 g 1   XARELTO 20 MG TABS tablet TAKE 1 TABLET BY MOUTH DAILY WITH SUPPER. 30 tablet 0   amLODipine (NORVASC) 10 MG tablet Take 1 tablet (10 mg total) by mouth daily. 90 tablet 3   chlorthalidone (HYGROTON) 25 MG tablet Take 0.5 tablets (12.5 mg total) by mouth daily. 90 tablet 3   sildenafil (VIAGRA) 100 MG tablet TAKE AS DIRECTED AS NEEDED     No current facility-administered medications for this visit.    Allergies-reviewed and updated No Known Allergies  Social History  Social History Narrative   Divorced. Daughter Elmyra Ricks (my patient). Lives with son matthew 40 in 2017. Also has family friend going through divorce living with him.       [employer] for [employer].    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 Oropharynx normal, sclera erythematous- slightly watering today Neck: no thyromegaly CV: RRR no murmurs rubs or gallops Lungs: CTAB no crackles, wheeze, rhonchi Abdomen: soft/nontender/nondistended/normal bowel sounds. No rebound or guarding.  Ext: 1+ edema equal bilaterally Skin: warm, dry Neuro: grossly normal, moves all  extremities, PERRLA    Assessment and Plan  56 y.o. male presenting for annual physical.  Health Maintenance counseling: 1. Anticipatory guidance: Patient counseled regarding regular dental exams -q6 months, eye exams -yearly,  avoiding smoking and second hand smoke , limiting alcohol to 2 beverages per day.  No illicit drugs  2. Risk factor reduction:  Advised patient of need for regular exercise and diet rich and fruits and vegetables to reduce risk of heart attack and stroke.  Exercise- tuesdays and thursdays 20 mins elliptical and then additional time 40 mins weight workout.Marland Kitchen also walking dog Diet/weight management-set back after surgery- up 19 lbs from around time of surgery plus holidays are hard. He plans on adjusting portions and eating healthier Wt Readings from Last 3 Encounters:  12/23/21 298 lb (135.2 kg)  08/01/21 279 lb 6.4 oz (126.7 kg)  07/09/21 280 lb (127 kg)  3. Immunizations/screenings/ancillary studies DISCUSSED:  -COVID booster vaccine #4 - opts out Immunization History  Administered Date(s) Administered   PFIZER(Purple Top)SARS-COV-2 Vaccination 01/05/2020, 01/30/2020, 09/26/2020   Tdap 02/05/2017   Zoster Recombinat (Shingrix) 06/18/2021, 11/07/2021  4. Prostate cancer screening- see below about prostate cancer Lab Results  Component Value Date   PSA 4.38 (H) 01/15/2021   PSA 4.38 01/15/2021   PSA 4.71 (H) 12/17/2020   5. Colon cancer screening - 11/12/18 with 3 year repeat planned due to polyps- refer today 6. Skin cancer screening- no dermatologist visits- agrees to referral.  advised regular sunscreen use. Denies worrisome, changing, or new skin lesions.  7. Smoking associated screening (lung cancer screening, AAA screen 65-75, UA)- never smoker 8. STD screening -  dating but monogamous so no concern for STDs  Status of chronic or acute concerns   #Prostate cancer S: robotic assisted prostatectomy in October on the 20th with Dr. Alinda Money.  He was referred  due to with elevated PSA-had been managed at [facility] and was originally diagnosed 04/17/2021  Last seen in January and PSA was undetectable!  His continence issues had largely resolved.  Was encouraged to be consistent with penile rehabilitation-he was using sildenafil- some erectile issues. Some urgency noted A/P: doing well after surgery- will follow up every 6 months    # Essential Hypertension S: medication: chlorthalidone 12.5 mg and amlodipine 10 mg daily A/P: Controlled. Continue current medications.   #Gout S: 0  flares since last visit on allopurinol 300 mg in the AM daily.  -we have discussed recent addition of chlorthalidone could increase risk of gout flares.  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  On aspirin alone per Dr. Marin Olp who had released patient.  -We will also prescribe Xarelto for plane travel at 20 mg daily  Possible Concern for recurrence 12/23/2021"-Pt c/o bilateral feet swelling that he noticed last week with more in the left foot. He states he does have a hx of DVT and he has had a little pain. He does have compression socks that he uses PRN." -reflects back and years and years ago even in 30 if feet hung off bed would get leg pain and some swelling within 48 hours- similar if feet crossed.  -also had pain and swelling when had DVT - last Thursday before last night fell asleep with shins hanging over end of bed and thought could be having similar issue -also had recently travelled to [place]  prior to that Thursday but took xarelto before trip and through last Thursday- started it feb 22 and through travel back into 28th - feet slightly swollen when he got home. March 2nd is when had painful feet but swelling persisted- did through the 9th and  then stopped- pain has resolved but swelling has persisted.  - on and off using compression stockings.  A/P: swelling improving. Pain has resolved. Issues were bilaterally- we opted to monitor unless has worsening symptoms- encouraged to use consistent compression stockings.   #hyperlipidemia S: Medication: none . He has cut down on egg yolks -mainly whites Lab Results  Component Value Date   CHOL 180 12/17/2020   HDL 47.90 12/17/2020   LDLCALC 105 (H) 09/01/2018   LDLDIRECT 105.0 12/17/2020   TRIG 211.0 (H) 12/17/2020   CHOLHDL 4 12/17/2020   A/P: The 10-year ASCVD risk score (Arnett DK, et al., 2019) is: 4.6%. as long as risk is stable this year- would not need meds- continue to work on diet/exercise/weight loss   #irritation in belly button still responds to mupirocin- no recent issues   #has seen eye doctor- eyes seem bloodshot- wonders if sleeps with eyes open. Eye doctor eval reassuring. No blurry vision. Encouraged follow up with optho if new or worsening symptoms  Recommended follow up: Return in about 6 months (around 06/25/2022) for follow up- or sooner if needed. Future Appointments  Date Time Provider [place]  12/26/2021  9:00 AM [facility]    Lab/Order associations: Not fasting-we will come back fasting for labs   ICD-10-CM   1. Preventative health care  Z00.00 CBC with Differential/Platelet    Comprehensive metabolic panel    Lipid panel    Uric acid    Ambulatory referral to Gastroenterology    2. Essential hypertension  I10 CBC with Differential/Platelet    Comprehensive metabolic panel    3. Idiopathic gout of multiple sites, unspecified chronicity  M10.09 Uric acid    4. Prostate cancer ([place])  C61     5. Elevated PSA  R97.20     6. Screen for colon cancer  Z12.11 Ambulatory referral to Gastroenterology      No orders of the defined types were placed in this encounter.  I,Jada Bradford,acting as a scribe for Garret Reddish,  MD.,have documented all relevant documentation on the behalf of Garret Reddish, MD,as directed by  Garret Reddish, MD while in the presence of Garret Reddish, MD.   I, Garret Reddish, MD, have reviewed all documentation for this visit. The documentation on  12/23/21 for the exam, diagnosis, procedures, and orders are all accurate and complete.   Return precautions advised.  Garret Reddish, MD

## 2021-12-23 ENCOUNTER — Ambulatory Visit (INDEPENDENT_AMBULATORY_CARE_PROVIDER_SITE_OTHER): Payer: BC Managed Care – PPO | Admitting: Family Medicine

## 2021-12-23 ENCOUNTER — Encounter: Payer: Self-pay | Admitting: Family Medicine

## 2021-12-23 ENCOUNTER — Encounter: Payer: Self-pay | Admitting: Gastroenterology

## 2021-12-23 VITALS — BP 110/70 | HR 96 | Temp 98.2°F | Ht 70.0 in | Wt 298.0 lb

## 2021-12-23 DIAGNOSIS — Z0000 Encounter for general adult medical examination without abnormal findings: Secondary | ICD-10-CM | POA: Diagnosis not present

## 2021-12-23 DIAGNOSIS — C61 Malignant neoplasm of prostate: Secondary | ICD-10-CM | POA: Diagnosis not present

## 2021-12-23 DIAGNOSIS — Z1211 Encounter for screening for malignant neoplasm of colon: Secondary | ICD-10-CM

## 2021-12-23 DIAGNOSIS — Z1283 Encounter for screening for malignant neoplasm of skin: Secondary | ICD-10-CM

## 2021-12-23 DIAGNOSIS — M1009 Idiopathic gout, multiple sites: Secondary | ICD-10-CM | POA: Diagnosis not present

## 2021-12-23 DIAGNOSIS — R972 Elevated prostate specific antigen [PSA]: Secondary | ICD-10-CM

## 2021-12-23 DIAGNOSIS — I10 Essential (primary) hypertension: Secondary | ICD-10-CM | POA: Diagnosis not present

## 2021-12-23 NOTE — Patient Instructions (Addendum)
[facility] contact ?Please call to schedule visit and/or procedure ?Address: [address], [place], [place] [address] ?Phone: [phone]  ? ?We will call you within two weeks about your referral to dermatology. If you do not hear within 2 weeks, give [place] a call.   ? ?Schedule a lab visit at the check out desk within 2 weeks. Return for future fasting labs meaning nothing but water after midnight please. Ok to take your medications with water.  ? ?Per optho notes ?Artificial Tear Drops (as needed) ?Use Systane Balance 3-4x/day as needed ?___________________________________ ?I would try this and if not improving follow up with optho within a month or sooner if worsens ? ?Recommended follow up: Return in about 6 months (around 06/25/2022) for follow up- or sooner if needed.  ?

## 2021-12-26 ENCOUNTER — Other Ambulatory Visit (INDEPENDENT_AMBULATORY_CARE_PROVIDER_SITE_OTHER): Payer: BC Managed Care – PPO

## 2021-12-26 DIAGNOSIS — M1009 Idiopathic gout, multiple sites: Secondary | ICD-10-CM | POA: Diagnosis not present

## 2021-12-26 DIAGNOSIS — Z0000 Encounter for general adult medical examination without abnormal findings: Secondary | ICD-10-CM | POA: Diagnosis not present

## 2021-12-26 DIAGNOSIS — I10 Essential (primary) hypertension: Secondary | ICD-10-CM | POA: Diagnosis not present

## 2021-12-26 LAB — LIPID PANEL
Cholesterol: 175 mg/dL (ref 0–200)
HDL: 48.5 mg/dL (ref >39.00)
LDL Cholesterol: 102 mg/dL — ABNORMAL HIGH (ref 0–99)
NonHDL: 126.23
Total CHOL/HDL Ratio: 4
Triglycerides: 120 mg/dL (ref 0.0–149.0)
VLDL: 24 mg/dL (ref 0.0–40.0)

## 2021-12-26 LAB — COMPREHENSIVE METABOLIC PANEL WITH GFR
ALT: 26 U/L (ref 0–53)
AST: 18 U/L (ref 0–37)
Albumin: 4.4 g/dL (ref 3.5–5.2)
Alkaline Phosphatase: 110 U/L (ref 39–117)
BUN: 18 mg/dL (ref 6–23)
CO2: 28 meq/L (ref 19–32)
Calcium: 9.1 mg/dL (ref 8.4–10.5)
Chloride: 103 meq/L (ref 96–112)
Creatinine, Ser: 1.11 mg/dL (ref 0.40–1.50)
GFR: 74.69 mL/min (ref >60.00)
Glucose, Bld: 103 mg/dL — ABNORMAL HIGH (ref 70–99)
Potassium: 4 meq/L (ref 3.5–5.1)
Sodium: 140 meq/L (ref 135–145)
Total Bilirubin: 0.7 mg/dL (ref 0.2–1.2)
Total Protein: 7 g/dL (ref 6.0–8.3)

## 2021-12-26 LAB — CBC WITH DIFFERENTIAL/PLATELET
Basophils Absolute: 0 K/uL (ref 0.0–0.1)
Basophils Relative: 0.6 % (ref 0.0–3.0)
Eosinophils Absolute: 0.3 K/uL (ref 0.0–0.7)
Eosinophils Relative: 5.5 % — ABNORMAL HIGH (ref 0.0–5.0)
HCT: 45.8 % (ref 39.0–52.0)
Hemoglobin: 15.4 g/dL (ref 13.0–17.0)
Lymphocytes Relative: 27.4 % (ref 12.0–46.0)
Lymphs Abs: 1.5 K/uL (ref 0.7–4.0)
MCHC: 33.6 g/dL (ref 30.0–36.0)
MCV: 90.8 fl (ref 78.0–100.0)
Monocytes Absolute: 0.5 K/uL (ref 0.1–1.0)
Monocytes Relative: 9.8 % (ref 3.0–12.0)
Neutro Abs: 3.1 K/uL (ref 1.4–7.7)
Neutrophils Relative %: 56.7 % (ref 43.0–77.0)
Platelets: 226 K/uL (ref 150.0–400.0)
RBC: 5.05 Mil/uL (ref 4.22–5.81)
RDW: 14.5 % (ref 11.5–15.5)
WBC: 5.6 K/uL (ref 4.0–10.5)

## 2021-12-26 LAB — URIC ACID: Uric Acid, Serum: 8.1 mg/dL — ABNORMAL HIGH (ref 4.0–7.8)

## 2021-12-29 ENCOUNTER — Other Ambulatory Visit: Payer: Self-pay | Admitting: Family Medicine

## 2021-12-30 ENCOUNTER — Encounter: Payer: Self-pay | Admitting: Gastroenterology

## 2022-01-07 ENCOUNTER — Other Ambulatory Visit: Payer: Self-pay | Admitting: Family Medicine

## 2022-01-14 ENCOUNTER — Encounter: Payer: Self-pay | Admitting: Family Medicine

## 2022-01-28 ENCOUNTER — Encounter: Payer: Self-pay | Admitting: Family Medicine

## 2022-01-28 DIAGNOSIS — R600 Localized edema: Secondary | ICD-10-CM

## 2022-01-29 NOTE — Telephone Encounter (Signed)
Dr. Hunter, please see message and advise. 

## 2022-02-06 ENCOUNTER — Telehealth: Payer: Self-pay | Admitting: Family Medicine

## 2022-02-06 MED ORDER — CHLORTHALIDONE 50 MG PO TABS: 25.0000 mg | ORAL_TABLET | Freq: Every day | ORAL | 3 refills | Status: DC

## 2022-02-06 NOTE — Telephone Encounter (Signed)
Ok to change

## 2022-02-06 NOTE — Telephone Encounter (Signed)
You can send in chlorthalidone 50 mg-take half tablet daily #90 with 3 refills ?

## 2022-02-06 NOTE — Telephone Encounter (Signed)
Rx sent to pharmacy   

## 2022-02-06 NOTE — Telephone Encounter (Signed)
Patient ins plan stated patient can recive a better discount switching chlorthalidone (HYGROTON) 25 MG tablet ?To '50mg'$  instead- stated we can change the rx.-  ? ?Please cal lif md authorizes [id number]  ? ?

## 2022-02-07 ENCOUNTER — Ambulatory Visit (HOSPITAL_COMMUNITY)
Admission: RE | Admit: 2022-02-07 | Discharge: 2022-02-07 | Disposition: A | Discharge status: Home / Self Care | Payer: BC Managed Care – PPO | Source: Ambulatory Visit | Attending: Cardiology | Admitting: Cardiology

## 2022-02-07 ENCOUNTER — Telehealth (INDEPENDENT_AMBULATORY_CARE_PROVIDER_SITE_OTHER): Payer: BC Managed Care – PPO | Admitting: Physician Assistant

## 2022-02-07 ENCOUNTER — Ambulatory Visit: Payer: BC Managed Care – PPO | Admitting: Gastroenterology

## 2022-02-07 ENCOUNTER — Encounter: Payer: Self-pay | Admitting: Gastroenterology

## 2022-02-07 ENCOUNTER — Encounter: Payer: Self-pay | Admitting: Physician Assistant

## 2022-02-07 ENCOUNTER — Telehealth: Payer: Self-pay | Admitting: *Deleted

## 2022-02-07 ENCOUNTER — Telehealth: Payer: Self-pay

## 2022-02-07 VITALS — BP 124/76 | HR 97 | Ht 70.0 in | Wt 297.0 lb

## 2022-02-07 VITALS — Ht 70.0 in | Wt 295.0 lb

## 2022-02-07 DIAGNOSIS — I82402 Acute embolism and thrombosis of unspecified deep veins of left lower extremity: Secondary | ICD-10-CM | POA: Diagnosis not present

## 2022-02-07 DIAGNOSIS — Z7901 Long term (current) use of anticoagulants: Secondary | ICD-10-CM

## 2022-02-07 DIAGNOSIS — R600 Localized edema: Secondary | ICD-10-CM | POA: Insufficient documentation

## 2022-02-07 DIAGNOSIS — Z86010 Personal history of colonic polyps: Secondary | ICD-10-CM | POA: Diagnosis not present

## 2022-02-07 MED ORDER — RIVAROXABAN (XARELTO) VTE STARTER PACK (15 & 20 MG): ORAL_TABLET | ORAL | 0 refills | Status: DC

## 2022-02-07 MED ORDER — SUTAB 1479-225-188 MG PO TABS: ORAL_TABLET | ORAL | 0 refills | Status: DC

## 2022-02-07 NOTE — Progress Notes (Addendum)
? ?HPI :  ?56 y/o male with a history of DVT / PE, on Xarelto, history of colon polyps, HTN, here to re-establish care for history of colon polyps and discuss surveillance colonoscopy.  ? ?He has a history of DVT with PE back in 2017, had been on Xarelto for some time. More recently he does NOT take it routinely, takes it now only PRN when he travels / flies. Otherwise takes baby aspirin at baseline. He does take amlodipine and for the past 2 months he has had some LE edema. Denies any pains in his calves. He is scheduled for an [place] today to make sure no evidence of recurrent DVT. He denies any cardiopulmonary symptoms. ? ?He denies any problems with his bowels. No blood in his stools. No abdominal pains. He has been doing well with his bowels. His last colonoscopy was in 10/2018, had 8 adenomas removed, all small, and an anal squamous papilloma.  ? ?Otherwise was diagnosed with prostate cancer in 2022 s/p surgery. He has not had XRT. States he has recovered from his surgery and doing well.  ? ? ?Colonoscopy 11/12/2018: ?- A single colonic angiodysplastic lesion. ?- One 3 mm polyp in the cecum, removed with a cold snare. Resected and retrieved. ?- One 3 mm polyp in the ascending colon, removed with a cold snare. Resected and retrieved. ?- One 3 mm polyp in the transverse colon, removed with a cold snare. Resected and ?retrieved. ?- One 4 mm polyp in the descending colon, removed with a cold snare. Resected and ?retrieved. ?- Two 3 to 4 mm polyps in the sigmoid colon, removed with a cold snare. Resected and ?retrieved. ?- One 7 mm polyp in the sigmoid colon, removed with a hot snare. Resected and retrieved. ?- One 7 mm polyp at the recto-sigmoid colon, removed with a hot snare. Resected and ?retrieved. ?- One diminutive polypoid lesion at the anal canal. Biopsied. ?- The examination was otherwise normal. ? ?1. Surgical [P], ascending colon; cecum; transverse; descending; sigmoid x3; recto-sigmoid; polyp (8) ?- TUBULAR  ADENOMA(S). ?- HIGH GRADE DYSPLASIA IS NOT IDENTIFIED. ?2. Surgical [P], anal canal polyp x1, polyp ?- SQUAMOUS PAPILLOMA. ?- DYSPLASIA IS NOT IDENTIFIED. ? ? ?Echocardiogram 07/2016 - EF 55-60%, grade I DD ? ? ?Past Medical History:  ?Diagnosis Date  ? Cancer [facility])   ? prostate  ? Chicken pox   ? Gout   ? Hypertension   ? Left leg DVT ([place]) 07/30/2016  ? Peripheral vascular disease ([place])   ? Pulmonary embolism ([place]) 07/18/2016  ? ? ? ?Past Surgical History:  ?Procedure Laterality Date  ? APPENDECTOMY  2003  ? CHOLECYSTECTOMY  2002  ? HERNIA REPAIR    ? as child- unsure location  ? LASIK    ? similar to but not lasik per patient  ? LYMPHADENECTOMY Bilateral 08/01/2021  ? Procedure: LYMPHADENECTOMY, PELVIC;  Surgeon: Raynelle Bring, MD;  [facility];  Service: Urology;  Laterality: Bilateral;  ? NASAL SEPTUM SURGERY  1996  ? ROBOT ASSISTED LAPAROSCOPIC RADICAL PROSTATECTOMY N/A 08/01/2021  ? Procedure: XI ROBOTIC ASSISTED LAPAROSCOPIC RADICAL PROSTATECTOMY LEVEL 2;  Surgeon: Raynelle Bring, MD;  [facility];  Service: Urology;  Laterality: N/A;  ? WISDOM TOOTH EXTRACTION    ? ?Family History  ?Problem Relation Age of Onset  ? Heart attack Father 6  ? Hypercalcemia Father   ? Diabetes Father   ? Hypertension Father   ? Uterine cancer Mother   ? Stroke Paternal Uncle   ?  Alcohol abuse Paternal Grandfather   ? Other Sister   ?     33 years older  ? Brain cancer Brother   ?     glioblastoma- 57 years older  ? Other Brother   ?     16 years older  ? Colon cancer Neg Hx   ? Colon polyps Neg Hx   ? Esophageal cancer Neg Hx   ? Rectal cancer Neg Hx   ? Stomach cancer Neg Hx   ? ?Social History  ? ?Tobacco Use  ? Smoking status: Never  ? Smokeless tobacco: Never  ?Vaping Use  ? Vaping Use: Never used  ?Substance Use Topics  ? Alcohol use: Yes  ?  Alcohol/week: 5.0 - 6.0 standard drinks  ?  Types: 5 - 6 Cans of beer per week  ?  Comment: weekly  ? Drug use: No  ? ?Current Outpatient Medications  ?Medication Sig  Dispense Refill  ? allopurinol (ZYLOPRIM) 300 MG tablet Take 1 tablet (300 mg total) by mouth daily. 90 tablet 3  ? chlorthalidone (HYGROTON) 50 MG tablet Take 0.5 tablets (25 mg total) by mouth daily. 90 tablet 3  ? mupirocin ointment (BACTROBAN) 2 % APPLY TWICE DAILY AS NEEDED TO BELLY BUTTON (Patient taking differently: Apply 1 application. topically 2 (two) times daily as needed (belly button infection).) 22 g 1  ? sildenafil (VIAGRA) 100 MG tablet TAKE AS DIRECTED AS NEEDED    ? XARELTO 20 MG TABS tablet TAKE 1 TABLET BY MOUTH DAILY WITH SUPPER 30 tablet 0  ? amLODipine (NORVASC) 10 MG tablet Take 1 tablet (10 mg total) by mouth daily. 90 tablet 3  ? ?No current facility-administered medications for this visit.  ? ?No Known Allergies ? ? ?Review of Systems: ?All systems reviewed and negative except where noted in HPI.  ? ? ?Lab Results  ?Component Value Date  ? WBC 5.6 12/26/2021  ? HGB 15.4 12/26/2021  ? HCT 45.8 12/26/2021  ? MCV 90.8 12/26/2021  ? PLT 226.0 12/26/2021  ? ? ?Lab Results  ?Component Value Date  ? CREATININE 1.11 12/26/2021  ? BUN 18 12/26/2021  ? NA 140 12/26/2021  ? K 4.0 12/26/2021  ? CL 103 12/26/2021  ? CO2 28 12/26/2021  ? ? ?Lab Results  ?Component Value Date  ? ALT 26 12/26/2021  ? AST 18 12/26/2021  ? ALKPHOS 110 12/26/2021  ? BILITOT 0.7 12/26/2021  ? ? ? ?Physical Exam: ?BP 124/76   Pulse 97   Ht '5\' 10"'$  (1.778 m)   Wt 297 lb (134.7 kg)   BMI 42.62 kg/m?  ?Constitutional: Pleasant,well-developed, male in no acute distress. ?Cardiovascular: Normal rate, regular rhythm.  ?Pulmonary/chest: Effort normal and breath sounds normal.  ?Abdominal: Soft, nondistended, nontender. There are no masses palpable.  ?Extremities: mild edema ?Lymphadenopathy: No cervical adenopathy noted. ?Neurological: Alert and oriented to person place and time. ?Skin: Skin is warm and dry. No rashes noted. ?Psychiatric: Normal mood and affect. Behavior is normal. ? ? ?ASSESSMENT AND PLAN: ?56 y/o male here to  re-establish care for the following: ? ?History of colon polyps ?Anticoagulation - history of DVT / PE ? ?Due for surveillance colonoscopy given history of colon polyps. I have discussed risks / benefits of anesthesia and colonoscopy with him and he wants to proceed. He is now NOT taking Xarelto routinely, using when traveling only to prevent recurrent DVT in that setting. In this case okay to proceed at this time. He is actually having an  [place] of his LE today in light of more chronic edema which may be due to amlodipine, but if this positive for recurrent DVT he will need to contact us, as this would delay his colonoscopy and would need to be rescheduled to a time when he can hold his Xarelto. Otherwise proceed with procedure if negative. ? ?Jolly Mango, MD ?[facility] ? ?CC: ?Marin Olp, MD ? ? ?ADDENDUM: ?I received a message from Inda Coke about the patient's ultrasound which was reportedly positive for recurrent DVT.  He needs to resume the Xarelto and I am assuming we will not be able to stop this for a few months until the clot is treated.  We will cancel his procedure that was just scheduled today and reschedule in a few months when he is able to hold his Xarelto for 2 days. ? ? ?

## 2022-02-07 NOTE — Progress Notes (Signed)
? ?Virtual Visit via Video Note  ? ?Jonathon [name], connected with  Jonathon [name]  ([id number], [birth year]) on 02/07/22 at  2:00 PM EDT by a video-enabled telemedicine application and verified that I am speaking with the correct person using two identifiers. ? ?Location: ?Patient: Home ?Provider: [place] office ?  ?I discussed the limitations of evaluation and management by telemedicine and the availability of in person appointments. The patient expressed understanding and agreed to proceed.   ? ?History of Present Illness: ?Jonathon [name] is a 56 y.o. who identifies as a male who was assigned male at birth, and is being seen today for positive DVT. ? ?Hx of unprovoked DVT w/ PE in 2017. Took xarelto as prescribed and was followed by Dr. Marin [name], who discharged him on [facility] ASA 162 mg po q day. He was diagnosed with prostate cancer last year. ? ?Saw PCP 12/23/21 for CPE and noted concerns of b/l feet swelling. Discussed at that time and patient agreed for watchful waiting. He had ongoing swelling and reached out to PCP on 01/28/22 to get u/s to evaluate for possible DVT. Ultrasound was ordered and completed this morning. We were notified that patient has positive DVT in L leg.  ? ?Currently taking EC ASA 162 mg po daily.  ? ?Problems:  ?Patient Active Problem List  ? Diagnosis Date Noted  ? Malignant neoplasm of prostate ([place]) 04/23/2021  ? Traumatic bursitis 11/12/2017  ? History of DVT (deep vein thrombosis) 07/30/2016  ? Gout 07/30/2016  ? OSA (obstructive sleep apnea) 07/30/2016  ? Essential hypertension 07/30/2016  ? History of pulmonary embolism 07/18/2016  ?  ?Allergies: No Known Allergies ?Medications:  ?Current Outpatient Medications:  ?  allopurinol (ZYLOPRIM) 300 MG tablet, Take 1 tablet (300 mg total) by mouth daily., Disp: 90 tablet, Rfl: 3 ?  aspirin EC 81 MG tablet, Take 162 mg by mouth daily. Swallow whole., Disp: , Rfl:  ?  chlorthalidone (HYGROTON) 50 MG tablet, Take 0.5  tablets (25 mg total) by mouth daily., Disp: 90 tablet, Rfl: 3 ?  mupirocin ointment (BACTROBAN) 2 %, APPLY TWICE DAILY AS NEEDED TO BELLY BUTTON (Patient taking differently: Apply 1 application. topically 2 (two) times daily as needed (belly button infection).), Disp: 22 g, Rfl: 1 ?  sildenafil (VIAGRA) 100 MG tablet, TAKE AS DIRECTED AS NEEDED, Disp: , Rfl:  ?  amLODipine (NORVASC) 10 MG tablet, Take 1 tablet (10 mg total) by mouth daily., Disp: 90 tablet, Rfl: 3 ?  Sodium Sulfate-Mag Sulfate-KCl (SUTAB) [phone] MG TABS, Use as directed for colonoscopy. MANUFACTURER CODES!! BIN: [id number] PCN: CN GROUP: [id number] MEMBER ID: [id number];YDX AS SECONDARY INSURANCE ;NO PRIOR AUTHORIZATION (Patient not taking: Reported on 02/07/2022), Disp: 24 tablet, Rfl: 0 ?  XARELTO 20 MG TABS tablet, TAKE 1 TABLET BY MOUTH DAILY WITH SUPPER (Patient not taking: Reported on 02/07/2022), Disp: 30 tablet, Rfl: 0 ? ?Observations/Objective: ?Patient is well-developed, well-nourished in no acute distress.  ?Resting comfortably  at home.  ?Head is normocephalic, atraumatic.  ?No labored breathing.  ?Speech is clear and coherent with logical content.  ?Patient is alert and oriented at baseline.  ? ? ?Assessment and Plan: ?1. Acute deep vein thrombosis (DVT) of left lower extremity, unspecified vein (HCC) ?Recurrent ?Stop ASA ?Start 15 mg xarelto BID x 21 days, then switch to 20 mg xarelto daily thereafter ?I have already reached out to Jonathon [name] about resumption of xarelto, given plans for upcoming colonoscopy. Will defer recommendations of when patient  can hold blood thinner to Dr. Marin [name], as patient is agreeable to re-establishing with him. I will also send staff message regarding this to GI, Jonathon [name] and PCP. ?Denies current cardiopulmonary symptoms -- if any develop in the meantime, recommend reaching out to our office or going to ER. ? ?Follow Up Instructions: ?I discussed the assessment and treatment plan with the patient. The  patient was provided an opportunity to ask questions and all were answered. The patient agreed with the plan and demonstrated an understanding of the instructions.  A copy of instructions were sent to the patient via MyChart unless otherwise noted below.  ? ?The patient was advised to call back or seek an in-person evaluation if the symptoms worsen or if the condition fails to improve as anticipated. ? ?Jonathon Coke, PA ?

## 2022-02-07 NOTE — Telephone Encounter (Signed)
Dr Havery Moros has received note from patient's PCP indicating that patient had positive DVT on ultrasound today. Therefore, Dr Havery Moros would like to cancel patient's previously scheduled colonoscopy and place him on a recall colonoscopy list for 10/2022. Patient will need xarelto clearance prior to any scheduling. ? ?I have spoken to patient to advise of this information and he verbalizes understanding. ? ? ?

## 2022-02-07 NOTE — Patient Instructions (Signed)
You have been scheduled for a colonoscopy. Please follow written instructions given to you at your visit today.  ?Please pick up your prep supplies at the pharmacy within the next 1-3 days. ?If you use inhalers (even only as needed), please bring them with you on the day of your procedure. ? ?If you are age 56 or older, your body mass index should be between 23-30. Your Body mass index is 42.62 kg/m?Marland Kitchen If this is out of the aforementioned range listed, please consider follow up with your Primary Care Provider. ? ?If you are age 72 or younger, your body mass index should be between 19-25. Your Body mass index is 42.62 kg/m?Marland Kitchen If this is out of the aformentioned range listed, please consider follow up with your Primary Care Provider.  ? ?________________________________________________________ ? ?The [facility] providers would like to encourage you to use [facility] to communicate with providers for non-urgent requests or questions.  Due to long hold times on the telephone, sending your provider a message by [facility] may be a faster and more efficient way to get a response.  Please allow 48 business hours for a response.  Please remember that this is for non-urgent requests.  ?_______________________________________________________ ? ?Due to recent changes in healthcare laws, you may see the results of your imaging and laboratory studies on MyChart before your provider has had a chance to review them.  We understand that in some cases there may be results that are confusing or concerning to you. Not all laboratory results come back in the same time frame and the provider may be waiting for multiple results in order to interpret others.  Please give [place] 48 hours in order for your provider to thoroughly review all the results before contacting the office for clarification of your results.  ? ?

## 2022-02-07 NOTE — Telephone Encounter (Signed)
Patient positive for DVT left leg. Discussed results with Jonathon Coke, PA. LMOVM for patient to be here at the office around 2 today. I will return call to make sure he got message ?

## 2022-02-12 ENCOUNTER — Inpatient Hospital Stay: Payer: BC Managed Care – PPO | Attending: Hematology & Oncology

## 2022-02-12 ENCOUNTER — Inpatient Hospital Stay: Payer: BC Managed Care – PPO | Admitting: Hematology & Oncology

## 2022-02-12 ENCOUNTER — Other Ambulatory Visit: Payer: Self-pay

## 2022-02-12 ENCOUNTER — Encounter: Payer: Self-pay | Admitting: Hematology & Oncology

## 2022-02-12 VITALS — BP 147/94 | HR 85 | Temp 98.3°F | Resp 18 | Ht 70.0 in | Wt 292.0 lb

## 2022-02-12 DIAGNOSIS — Z9079 Acquired absence of other genital organ(s): Secondary | ICD-10-CM

## 2022-02-12 DIAGNOSIS — Z79899 Other long term (current) drug therapy: Secondary | ICD-10-CM

## 2022-02-12 DIAGNOSIS — Z7901 Long term (current) use of anticoagulants: Secondary | ICD-10-CM | POA: Diagnosis not present

## 2022-02-12 DIAGNOSIS — Z86718 Personal history of other venous thrombosis and embolism: Secondary | ICD-10-CM

## 2022-02-12 DIAGNOSIS — Z8546 Personal history of malignant neoplasm of prostate: Secondary | ICD-10-CM

## 2022-02-12 LAB — CMP (CANCER CENTER ONLY)
ALT: 30 U/L (ref 0–44)
AST: 21 U/L (ref 15–41)
Albumin: 4.6 g/dL (ref 3.5–5.0)
Alkaline Phosphatase: 80 U/L (ref 38–126)
Anion gap: 8 (ref 5–15)
BUN: 18 mg/dL (ref 6–20)
CO2: 31 mmol/L (ref 22–32)
Calcium: 9.8 mg/dL (ref 8.9–10.3)
Chloride: 99 mmol/L (ref 98–111)
Creatinine: 1.13 mg/dL (ref 0.61–1.24)
GFR, Estimated: >60 mL/min (ref >60)
Glucose, Bld: 86 mg/dL (ref 70–99)
Potassium: 4 mmol/L (ref 3.5–5.1)
Sodium: 138 mmol/L (ref 135–145)
Total Bilirubin: 0.9 mg/dL (ref 0.3–1.2)
Total Protein: 7.5 g/dL (ref 6.5–8.1)

## 2022-02-12 LAB — D-DIMER, QUANTITATIVE: D-Dimer, Quant: <0.27 ug{FEU}/mL (ref 0.00–0.50)

## 2022-02-12 LAB — CBC WITH DIFFERENTIAL (CANCER CENTER ONLY)
Abs Immature Granulocytes: 0.02 10*3/uL (ref 0.00–0.07)
Basophils Absolute: 0 10*3/uL (ref 0.0–0.1)
Basophils Relative: 1 %
Eosinophils Absolute: 0.3 10*3/uL (ref 0.0–0.5)
Eosinophils Relative: 6 %
HCT: 46.3 % (ref 39.0–52.0)
Hemoglobin: 16 g/dL (ref 13.0–17.0)
Immature Granulocytes: 0 %
Lymphocytes Relative: 27 %
Lymphs Abs: 1.5 10*3/uL (ref 0.7–4.0)
MCH: 30.7 pg (ref 26.0–34.0)
MCHC: 34.6 g/dL (ref 30.0–36.0)
MCV: 88.9 fL (ref 80.0–100.0)
Monocytes Absolute: 0.7 10*3/uL (ref 0.1–1.0)
Monocytes Relative: 13 %
Neutro Abs: 3 10*3/uL (ref 1.7–7.7)
Neutrophils Relative %: 53 %
Platelet Count: 194 10*3/uL (ref 150–400)
RBC: 5.21 MIL/uL (ref 4.22–5.81)
RDW: 13.4 % (ref 11.5–15.5)
WBC Count: 5.6 10*3/uL (ref 4.0–10.5)
nRBC: 0 % (ref 0.0–0.2)

## 2022-02-13 ENCOUNTER — Telehealth: Payer: Self-pay

## 2022-02-13 NOTE — Progress Notes (Signed)
?Hematology and Oncology Follow Up Visit ? ?Jonathon [name] ?[id number] ?08/21/1966 56 y.o. ?02/13/2022 ? ? 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 ?Current Therapy:   ?Xarelto 20 mg p.o. daily-started on 02/07/2022  ?    ?Interim History:  Mr. Jonathon [name] is back for a long awaited visit.  We last saw him 3 and half years ago.  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  He was on Xarelto for a total of 2 years.  He was on 1 year of therapeutic and then 1 year of maintenance.  We then had him on low-dose aspirin and 162 mg a day. ? ?He is doing okay.  He does traveling for his job.  He has done quite a bit of traveling recently. ? ?He has been complaining of some swelling in the left leg.  This is where he had the original thrombus. ? ?He subsequently went to his family doctor.  Dr. Yong Channel, as always, was very thorough, and went ahead and did a Doppler.  This showed that there was a possible thrombus that was new in the left popliteal vein. ? ?Dr. Yong Channel put Mr. Outten on [place].  He is on the starter pack right now. ? ?Mr. Weisel says the leg feels a little bit better.  He does have a little bit of swelling.  He is on amlodipine. ? ?He has had no issues with cough or shortness of breath.  There is no chest wall pain. ? ?Today, his D-dimer is less than 0.27. ? ?He has had no bleeding.  There is no change in bowel or bladder habits. ? ?Of note, back in October 2022, he had prostate cancer.  I think this was early stage prostate cancer.  He had a radical prostatectomy.  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  There were 14 lymph nodes that were all negative.  I would say that he probably had stage I disease.  He did not require any adjuvant therapy. ? ?He has had no problems with tobacco use.  He does have a occasional alcohol  use. ? ?He has had no problems with COVID. ? ?Overall, I would say his performance status is probably ECOG 0. ? ?Medications:  ?Current Outpatient Medications:  ?  allopurinol (ZYLOPRIM) 300 MG tablet, Take 1 tablet (300 mg total) by mouth daily., Disp: 90 tablet, Rfl: 3 ?  amLODipine (NORVASC) 10 MG tablet, Take 1 tablet (10 mg total) by mouth daily., Disp: 90 tablet, Rfl: 3 ?  chlorthalidone (HYGROTON) 50 MG tablet, Take 0.5 tablets (25 mg total) by mouth daily., Disp: 90 tablet, Rfl: 3 ?  mupirocin ointment (BACTROBAN) 2 %, APPLY TWICE DAILY AS NEEDED TO BELLY BUTTON (Patient taking differently: Apply 1 application. topically 2 (two) times daily as needed (belly button infection).), Disp: 22 g, Rfl: 1 ?  RIVAROXABAN (XARELTO) VTE STARTER PACK (15 & 20 MG), Follow package directions: Take one '15mg'$  tablet by mouth twice a day. On day 22, switch to one '20mg'$  tablet once a day. Take with food., Disp: 51 each, Rfl: 0 ?  sildenafil (VIAGRA) 100 MG tablet, TAKE AS DIRECTED AS NEEDED, Disp: , Rfl:  ?  Sodium Sulfate-Mag Sulfate-KCl (SUTAB) [phone] MG TABS, Use as directed for colonoscopy. MANUFACTURER CODES!! BIN: [id number] PCN: CN GROUP: [id number] MEMBER ID: [id number];[employer] AS SECONDARY INSURANCE ;NO PRIOR  AUTHORIZATION (Patient not taking: Reported on 02/07/2022), Disp: 24 tablet, Rfl: 0 ? ?Allergies: No Known Allergies ? ?Past Medical History, Surgical history, Social history, and Family History were reviewed and updated. ? ?Review of Systems: ?Review of Systems  ?Constitutional: Negative.   ?HENT:  Negative.  Negative for hearing loss.   ?Eyes: Negative.   ?Respiratory: Negative.    ?Cardiovascular: Negative.   ?Gastrointestinal: Negative.   ?Endocrine: Negative.   ?Genitourinary: Negative.    ?Musculoskeletal: Negative.   ?Skin: Negative.   ?Neurological: Negative.   ?Hematological: Negative.   ?Psychiatric/Behavioral: Negative.    ? ?Physical Exam: ? height is '5\' 10"'$  (1.778 m) and weight is 292 lb (132.5 kg). His  oral temperature is 98.3 ?F (36.8 ?C). His blood pressure is 147/94 (abnormal) and his pulse is 85. His respiration is 18 and oxygen saturation is 91%.  ? ?Wt Readings from Last 3 Encounters:  ?02/12/22 292 lb (132.5 kg)  ?02/07/22 295 lb (133.8 kg)  ?02/07/22 297 lb (134.7 kg)  ? ? ?Physical Exam ?Vitals reviewed.  ?HENT:  ?   Head: Normocephalic and atraumatic.  ?Eyes:  ?   Pupils: Pupils are equal, round, and reactive to light.  ?Cardiovascular:  ?   Rate and Rhythm: Normal rate and regular rhythm.  ?   Heart sounds: Normal heart sounds.  ?Pulmonary:  ?   Effort: Pulmonary effort is normal.  ?   Breath sounds: Normal breath sounds.  ?Abdominal:  ?   General: Bowel sounds are normal.  ?   Palpations: Abdomen is soft.  ?Musculoskeletal:     ?   General: No tenderness or deformity. Normal range of motion.  ?   Cervical back: Normal range of motion.  ?Lymphadenopathy:  ?   Cervical: No cervical adenopathy.  ?Skin: ?   General: Skin is warm and dry.  ?   Findings: No erythema or rash.  ?Neurological:  ?   Mental Status: He is alert and oriented to person, place, and time.  ?Psychiatric:     ?   Behavior: Behavior normal.     ?   Thought Content: Thought content normal.     ?   Judgment: Judgment normal.  ? ? ? ?Lab Results  ?Component Value Date  ? WBC 5.6 02/12/2022  ? HGB 16.0 02/12/2022  ? HCT 46.3 02/12/2022  ? MCV 88.9 02/12/2022  ? PLT 194 02/12/2022  ? ?  Chemistry   ?   ?Component Value Date/Time  ? NA 138 02/12/2022 1109  ? NA 141 04/08/2017 1126  ? K 4.0 02/12/2022 1109  ? K 3.9 04/08/2017 1126  ? CL 99 02/12/2022 1109  ? CL 108 04/08/2017 1126  ? CO2 31 02/12/2022 1109  ? CO2 25 04/08/2017 1126  ? BUN 18 02/12/2022 1109  ? BUN 21 04/08/2017 1126  ? CREATININE 1.13 02/12/2022 1109  ? CREATININE 0.9 04/08/2017 1126  ?    ?Component Value Date/Time  ? CALCIUM 9.8 02/12/2022 1109  ? CALCIUM 9.5 04/08/2017 1126  ? ALKPHOS 80 02/12/2022 1109  ? ALKPHOS 149 (H) 04/08/2017 1126  ? AST 21 02/12/2022 1109  ? ALT 30  02/12/2022 1109  ? ALT 44 04/08/2017 1126  ? BILITOT 0.9 02/12/2022 1109  ?  ? ? ?Impression and Plan: ?Mr. Yeung is a very nice 56 year old white male.  Looks that he may have another thrombus.  Again it is really hard to tell on the Doppler if this is truly a new thrombus.  Regardless,  he will be on anticoagulation right now.  I do not see a problem with him being on full dose anticoagulation. ? ?It is reassuring that the D-dimer is normal. ? ?I think the swelling probably is from his amlodipine. ? ?I would like to see him back in 3 months.  We will get a Doppler of his leg before I see him back.  We need the Doppler done where he had the original Doppler done so that we they can compare the 2 on the same machine. ? ?I do not see a problem with him being on Xarelto at a full dose right now. ? ?May be possible that we are going to have to have him on some form of low-dose Xarelto long-term.  This may be the way to go since he does travel quite a bit. ? ?It was nice to see him again.  It is always a lot of fun to talk to him. ? ? ?Volanda Napoleon, MD ?5/4/20237:18 AM  ?

## 2022-02-13 NOTE — Telephone Encounter (Signed)
Called and spoke to patient.  He understands we will wait until August to schedule his colonoscopy.  He indicated that he may have some vacation plans in August and may want to wait until September to schedule.  I let him know I will reach out to him in July to schedule his procedure and request clearance from Dr. Marin Olp.  ?

## 2022-02-13 NOTE — Telephone Encounter (Signed)
-----   Message from Yetta Flock, MD sent at 02/12/2022  7:25 PM EDT ----- ?Regarding: FW: anticoagulation question for mutual patient ?Jan can you help make a note of this. ?Patient won't be allowed to stop his anticogulation for the next 3 months, after which hopefully we can do his colonoscopy. Can you please let him know and also place a recall for colonoscopy in 3 months, we can contact him to schedule at that time. Thanks ? ?----- Message ----- ?From: Volanda Napoleon, MD ?Sent: 02/12/2022   6:33 AM EDT ?To: Yetta Flock, MD ?Subject: RE: anticoagulation question for mutual pati# ? ?He needs to be on anti-coagulation for 3 months before stopping this.  pete ?----- Message ----- ?From: Yetta Flock, MD ?Sent: 02/11/2022   4:56 PM EDT ?To: Inda Coke, PA, Volanda Napoleon, MD, # ?Subject: RE: anticoagulation question for mutual pati# ? ?Thanks Aldona Bar, ? ?Yes it would be helpful to know how long he needs to be on anticoagulation for the DVT before we can electively hold it 2 days for a procedure, so we can let him know. Thank you! ? ?Richardson Landry ? ?----- Message ----- ?From: Inda Coke, PA ?Sent: 02/08/2022   2:11 PM EDT ?To: Volanda Napoleon, MD, Marin Olp, MD, # ?Subject: anticoagulation question for mutual patient   ? ?This is a current patient of Dr. Garret Reddish (PCP) and Dr. Hartline Cellar (GI). Patient was followed by Dr. 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 ? ?Patient is positive for DVT. I have restarted his Xarelto and after discussion with patient, re-referred him back to Dr. Marin Olp for further management and discussion of this. Patient is also due for surveillance colonoscopy and was seen yesterday by [facility] to discuss this. Per my conversation with Dr. Havery Moros, he is requesting clarification on how long the patient will need to be on therapy before it can be held. ? ?Dr. Yong Channel and/or  Dr. Marin Olp, please advise  on how long the patient will need to be on his anticoagulation therapy before it can be held for screening colonoscopy purposes. ? ?Also just sending this message to everyone as FYI to update on patient's new DVT. ? ?Thank you for your time and expertise, ? ?Inda Coke PA-C ?[facility] ? ? ? ? ? ? ?

## 2022-02-18 LAB — LUPUS ANTICOAGULANT PANEL
DRVVT: >180 s — ABNORMAL HIGH (ref 0.0–47.0)
PTT Lupus Anticoagulant: 53.9 s — ABNORMAL HIGH (ref 0.0–43.5)

## 2022-02-18 LAB — DRVVT CONFIRM: dRVVT Confirm: >2.2 ratio — ABNORMAL HIGH (ref 0.8–1.2)

## 2022-02-18 LAB — PTT-LA MIX: PTT-LA Mix: 47.8 s — ABNORMAL HIGH (ref 0.0–40.5)

## 2022-02-18 LAB — DRVVT MIX: dRVVT Mix: 120.2 s — ABNORMAL HIGH (ref 0.0–40.4)

## 2022-02-18 LAB — HEXAGONAL PHASE PHOSPHOLIPID: Hexagonal Phase Phospholipid: 10 s (ref 0–11)

## 2022-03-14 ENCOUNTER — Encounter: Payer: Self-pay | Admitting: Family Medicine

## 2022-03-14 MED ORDER — COLCHICINE 0.6 MG PO TABS: ORAL_TABLET | ORAL | 1 refills | Status: AC

## 2022-03-21 ENCOUNTER — Encounter: Payer: BC Managed Care – PPO | Admitting: Gastroenterology

## 2022-04-03 IMAGING — NM NM BONE WHOLE BODY
2 series · 2 of 2 positions shown · non-contrast
Comparison: None.

CLINICAL DATA: Prostate cancer

EXAM:
NUCLEAR MEDICINE WHOLE BODY BONE SCAN
TECHNIQUE: Whole body anterior and posterior images were obtained approximately
3 hours after intravenous injection of radiopharmaceutical.
RADIOPHARMACEUTICALS:  21.5 mCi 7echnetium-EEm MDP IV

[Series 1: whole body · 2.66mm/px · 1 of 1 slices shown (1 of 2)]
[im 1/1]
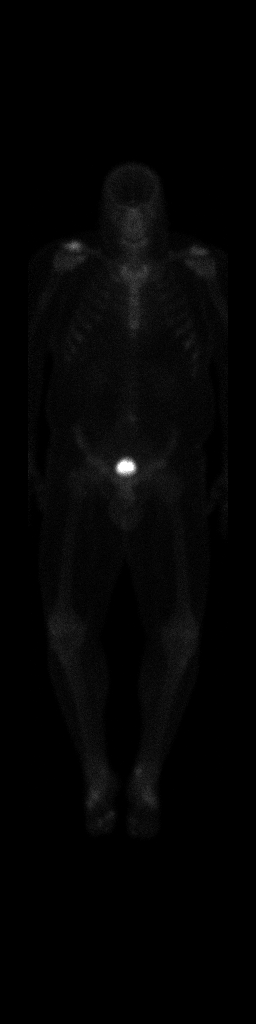

[Series 1: whole body · 2.66mm/px · 1 of 1 slices shown (2 of 2)]
[im 1/1]
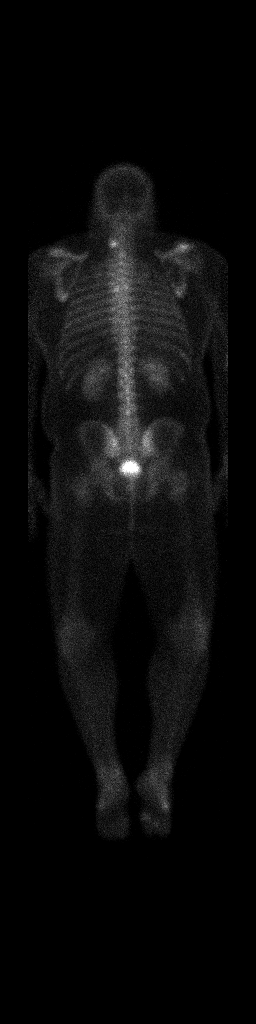

[2 of 2 positions shown; findings below may reference images not displayed]

FINDINGS: Increased uptake noted in the shoulders and left lateral lower
cervical spine region, likely degenerative. No suspicious osseous
uptake seen to suggest metastatic disease.
IMPRESSION: No evidence of osseous metastatic disease.

## 2022-04-30 ENCOUNTER — Telehealth: Payer: Self-pay

## 2022-04-30 DIAGNOSIS — L821 Other seborrheic keratosis: Secondary | ICD-10-CM | POA: Diagnosis not present

## 2022-04-30 DIAGNOSIS — D1801 Hemangioma of skin and subcutaneous tissue: Secondary | ICD-10-CM | POA: Diagnosis not present

## 2022-04-30 DIAGNOSIS — D225 Melanocytic nevi of trunk: Secondary | ICD-10-CM | POA: Diagnosis not present

## 2022-04-30 DIAGNOSIS — D1721 Benign lipomatous neoplasm of skin and subcutaneous tissue of right arm: Secondary | ICD-10-CM | POA: Diagnosis not present

## 2022-04-30 DIAGNOSIS — D224 Melanocytic nevi of scalp and neck: Secondary | ICD-10-CM | POA: Diagnosis not present

## 2022-04-30 NOTE — Telephone Encounter (Signed)
Called and spoke to patient. Scheduled him for colonoscopy with Dr. Havery Moros on Tuesday 9-19 and a  Previsit on Tues 9-5 at 11:00 am.  Request sent to Dr. Marin Olp requesting clearance to hold Xarelto for 2 days prior to procedure on 9-19.

## 2022-04-30 NOTE — Telephone Encounter (Signed)
Called patient and LM re: clearance to hold Xarelto from Dr. Marin Olp

## 2022-04-30 NOTE — Telephone Encounter (Signed)
   Layton Naves [facility] 11/04/1965 [id number]  Dear Dr. Marin Olp:  We have scheduled the above named patient for a Colonoscopy procedure with Dr. Havery Moros. Our records show that he is on anticoagulation therapy.  Please advise as to whether the patient may come off their therapy of Xarelto 2 days prior to their procedure which is scheduled for 07-01-22.  Please route your response to Lemar Lofty, CMA or fax response to [phone]. Thank you.  Sincerely,    [facility]

## 2022-04-30 NOTE — Telephone Encounter (Signed)
-----   Message from Roetta Sessions, [place] sent at 02/13/2022 10:03 AM EDT ----- Regarding: schedule colon and get anticoag clearance Per Dr. Marin Olp patient will need to be on Xarelto for DVT until August.  Call patient to schedule colonoscopy (dx of colon polyps) with [facility] and  request clearance for patient to hold Xarelto for 2 days.  Patient may elect September due to possible vacation plans in August.  Patient was seen in the office 02-07-22.

## 2022-05-16 ENCOUNTER — Encounter: Payer: Self-pay | Admitting: Hematology & Oncology

## 2022-05-16 ENCOUNTER — Other Ambulatory Visit: Payer: Self-pay

## 2022-05-16 ENCOUNTER — Ambulatory Visit (HOSPITAL_BASED_OUTPATIENT_CLINIC_OR_DEPARTMENT_OTHER): Payer: BC Managed Care – PPO

## 2022-05-16 ENCOUNTER — Inpatient Hospital Stay: Payer: BC Managed Care – PPO | Attending: Hematology & Oncology

## 2022-05-16 ENCOUNTER — Inpatient Hospital Stay: Payer: BC Managed Care – PPO | Admitting: Hematology & Oncology

## 2022-05-16 VITALS — BP 139/84 | HR 88 | Temp 98.2°F | Resp 18 | Wt 300.0 lb

## 2022-05-16 DIAGNOSIS — Z86711 Personal history of pulmonary embolism: Secondary | ICD-10-CM

## 2022-05-16 DIAGNOSIS — Z7901 Long term (current) use of anticoagulants: Secondary | ICD-10-CM | POA: Insufficient documentation

## 2022-05-16 DIAGNOSIS — I82432 Acute embolism and thrombosis of left popliteal vein: Secondary | ICD-10-CM | POA: Insufficient documentation

## 2022-05-16 LAB — CMP (CANCER CENTER ONLY)
ALT: 26 U/L (ref 0–44)
AST: 19 U/L (ref 15–41)
Albumin: 4.7 g/dL (ref 3.5–5.0)
Alkaline Phosphatase: 106 U/L (ref 38–126)
Anion gap: 8 (ref 5–15)
BUN: 13 mg/dL (ref 6–20)
CO2: 32 mmol/L (ref 22–32)
Calcium: 9.7 mg/dL (ref 8.9–10.3)
Chloride: 99 mmol/L (ref 98–111)
Creatinine: 1.15 mg/dL (ref 0.61–1.24)
GFR, Estimated: >60 mL/min (ref >60)
Glucose, Bld: 93 mg/dL (ref 70–99)
Potassium: 3.6 mmol/L (ref 3.5–5.1)
Sodium: 139 mmol/L (ref 135–145)
Total Bilirubin: 0.6 mg/dL (ref 0.3–1.2)
Total Protein: 7.2 g/dL (ref 6.5–8.1)

## 2022-05-16 LAB — CBC WITH DIFFERENTIAL (CANCER CENTER ONLY)
Abs Immature Granulocytes: 0.04 10*3/uL (ref 0.00–0.07)
Basophils Absolute: 0 10*3/uL (ref 0.0–0.1)
Basophils Relative: 1 %
Eosinophils Absolute: 0.4 10*3/uL (ref 0.0–0.5)
Eosinophils Relative: 6 %
HCT: 46.4 % (ref 39.0–52.0)
Hemoglobin: 15.9 g/dL (ref 13.0–17.0)
Immature Granulocytes: 1 %
Lymphocytes Relative: 28 %
Lymphs Abs: 1.9 10*3/uL (ref 0.7–4.0)
MCH: 30.5 pg (ref 26.0–34.0)
MCHC: 34.3 g/dL (ref 30.0–36.0)
MCV: 89.1 fL (ref 80.0–100.0)
Monocytes Absolute: 0.9 10*3/uL (ref 0.1–1.0)
Monocytes Relative: 13 %
Neutro Abs: 3.4 10*3/uL (ref 1.7–7.7)
Neutrophils Relative %: 51 %
Platelet Count: 235 10*3/uL (ref 150–400)
RBC: 5.21 MIL/uL (ref 4.22–5.81)
RDW: 13.5 % (ref 11.5–15.5)
WBC Count: 6.6 10*3/uL (ref 4.0–10.5)
nRBC: 0 % (ref 0.0–0.2)

## 2022-05-16 LAB — D-DIMER, QUANTITATIVE: D-Dimer, Quant: <0.27 ug/mL-FEU (ref 0.00–0.50)

## 2022-05-16 NOTE — Progress Notes (Signed)
Hematology and Oncology Follow Up Visit  Jonathon [name] [id number] 04-Nov-1965 56 y.o. 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 daily-started on 02/07/2022      Interim History:  Jonathon [name] is back for a follow-up.  He is.  He reports he has not yet had his follow-up Doppler.  He still has some swelling in his legs.  This might be more so from the amlodipine then from the clot.  Still not sure whether he does have a new blood clot in the leg.  He is traveling.  He has been going up to [place].  He has been quite active.  He has had no bleeding.  He has had no cough or shortness of breath.  He has had no chest pain.  There is been no change in bowel or bladder habits.  He has had no rashes.  Overall, I would say his performance status is probably ECOG 0.   Medications:  Current Outpatient Medications:    allopurinol (ZYLOPRIM) 300 MG tablet, Take 1 tablet (300 mg total) by mouth daily., Disp: 90 tablet, Rfl: 3   amLODipine (NORVASC) 10 MG tablet, Take 1 tablet (10 mg total) by mouth daily., Disp: 90 tablet, Rfl: 3   chlorthalidone (HYGROTON) 50 MG tablet, Take 0.5 tablets (25 mg total) by mouth daily., Disp: 90 tablet, Rfl: 3   colchicine 0.6 MG tablet, Take 2 tablets at first sign of gout flare repeat in 1 hour if not improved (max 3 in first day) then max 1 per day on following days until resolved, Disp: 30 tablet, Rfl: 1   mupirocin ointment (BACTROBAN) 2 %, APPLY TWICE DAILY AS NEEDED TO BELLY BUTTON (Patient taking differently: Apply 1 application. topically 2 (two) times daily as needed (belly button infection).), Disp: 22 g, Rfl: 1   sildenafil (VIAGRA) 100 MG tablet, TAKE AS DIRECTED AS NEEDED, Disp: , Rfl:    Sodium Sulfate-Mag Sulfate-KCl (SUTAB) [phone] MG TABS, Use as directed for colonoscopy. MANUFACTURER CODES!! BIN: [id number] PCN: CN GROUP: [id number] MEMBER ID:  [id number];[employer] AS SECONDARY INSURANCE ;NO PRIOR AUTHORIZATION (Patient not taking: Reported on 02/07/2022), Disp: 24 tablet, Rfl: 0  Allergies: No Known Allergies  Past Medical History, Surgical history, Social history, and Family History were reviewed and updated.  Review of Systems: Review of Systems  Constitutional: Negative.   HENT:  Negative.  Negative for hearing loss.   Eyes: Negative.   Respiratory: Negative.    Cardiovascular: Negative.   Gastrointestinal: Negative.   Endocrine: Negative.   Genitourinary: Negative.    Musculoskeletal: Negative.   Skin: Negative.   Neurological: Negative.   Hematological: Negative.   Psychiatric/Behavioral: Negative.      Physical Exam:  weight is 300 lb (136.1 kg). His oral temperature is 98.2 F (36.8 C). His blood pressure is 139/84 and his pulse is 88. His respiration is 18 and oxygen saturation is 93%.   Wt Readings from Last 3 Encounters:  05/16/22 300 lb (136.1 kg)  02/12/22 292 lb (132.5 kg)  02/07/22 295 lb (133.8 kg)    Physical Exam Vitals reviewed.  HENT:     Head: Normocephalic and atraumatic.  Eyes:     Pupils: Pupils are equal, round, and reactive to light.  Cardiovascular:     Rate and Rhythm: Normal rate and regular rhythm.     Heart sounds: Normal heart sounds.  Pulmonary:     Effort: Pulmonary  effort is normal.     Breath sounds: Normal breath sounds.  Abdominal:     General: Bowel sounds are normal.     Palpations: Abdomen is soft.  Musculoskeletal:        General: No tenderness or deformity. Normal range of motion.     Cervical back: Normal range of motion.  Lymphadenopathy:     Cervical: No cervical adenopathy.  Skin:    General: Skin is warm and dry.     Findings: No erythema or rash.  Neurological:     Mental Status: He is alert and oriented to person, place, and time.  Psychiatric:        Behavior: Behavior normal.        Thought Content: Thought content normal.        Judgment: Judgment  normal.     Lab Results  Component Value Date   WBC 6.6 05/16/2022   HGB 15.9 05/16/2022   HCT 46.4 05/16/2022   MCV 89.1 05/16/2022   PLT 235 05/16/2022     Chemistry      Component Value Date/Time   NA 139 05/16/2022 1213   NA 141 04/08/2017 1126   K 3.6 05/16/2022 1213   K 3.9 04/08/2017 1126   CL 99 05/16/2022 1213   CL 108 04/08/2017 1126   CO2 32 05/16/2022 1213   CO2 25 04/08/2017 1126   BUN 13 05/16/2022 1213   BUN 21 04/08/2017 1126   CREATININE 1.15 05/16/2022 1213   CREATININE 0.9 04/08/2017 1126      Component Value Date/Time   CALCIUM 9.7 05/16/2022 1213   CALCIUM 9.5 04/08/2017 1126   ALKPHOS 106 05/16/2022 1213   ALKPHOS 149 (H) 04/08/2017 1126   AST 19 05/16/2022 1213   ALT 26 05/16/2022 1213   ALT 44 04/08/2017 1126   BILITOT 0.6 05/16/2022 1213      Impression and Plan: Jonathon [name] is a very nice 56 year old white male.  Looks that he may have another thrombus.  Again it is really hard to tell on the Doppler if this is truly a new thrombus.  Regardless, he will be on anticoagulation right now.  I do not see a problem with him being on full dose anticoagulation.  The D-dimer today is less than 0.27.  He will have a repeat Doppler next week.  We will have to see what it shows.  Given the fact that he does travel quite a bit, I think it would be reasonable to have him on some kind of anticoagulation.  I would like to get him back to see me in about 3 months.  We can get him back sooner if there is anything on the Doppler that is unexpected.   Volanda Napoleon, MD 8/4/202312:55 PM

## 2022-05-19 ENCOUNTER — Encounter: Payer: Self-pay | Admitting: *Deleted

## 2022-05-19 ENCOUNTER — Ambulatory Visit (HOSPITAL_BASED_OUTPATIENT_CLINIC_OR_DEPARTMENT_OTHER)
Admission: RE | Admit: 2022-05-19 | Discharge: 2022-05-19 | Disposition: A | Discharge status: Home / Self Care | Payer: BC Managed Care – PPO | Source: Ambulatory Visit | Attending: Hematology & Oncology | Admitting: Hematology & Oncology

## 2022-05-19 DIAGNOSIS — Z86718 Personal history of other venous thrombosis and embolism: Secondary | ICD-10-CM | POA: Insufficient documentation

## 2022-05-19 DIAGNOSIS — I82532 Chronic embolism and thrombosis of left popliteal vein: Secondary | ICD-10-CM | POA: Diagnosis not present

## 2022-05-19 DIAGNOSIS — I82412 Acute embolism and thrombosis of left femoral vein: Secondary | ICD-10-CM | POA: Diagnosis not present

## 2022-05-29 ENCOUNTER — Other Ambulatory Visit: Payer: Self-pay | Admitting: Family Medicine

## 2022-05-29 DIAGNOSIS — C61 Malignant neoplasm of prostate: Secondary | ICD-10-CM | POA: Diagnosis not present

## 2022-06-04 ENCOUNTER — Other Ambulatory Visit: Payer: Self-pay | Admitting: Family Medicine

## 2022-06-11 DIAGNOSIS — N5201 Erectile dysfunction due to arterial insufficiency: Secondary | ICD-10-CM | POA: Diagnosis not present

## 2022-06-11 DIAGNOSIS — C61 Malignant neoplasm of prostate: Secondary | ICD-10-CM | POA: Diagnosis not present

## 2022-06-17 ENCOUNTER — Ambulatory Visit (AMBULATORY_SURGERY_CENTER): Payer: Self-pay | Admitting: *Deleted

## 2022-06-17 ENCOUNTER — Encounter: Payer: Self-pay | Admitting: Gastroenterology

## 2022-06-17 VITALS — Ht 70.0 in | Wt 285.0 lb

## 2022-06-17 DIAGNOSIS — Z86010 Personal history of colonic polyps: Secondary | ICD-10-CM

## 2022-06-17 MED ORDER — SUTAB 1479-225-188 MG PO TABS: 1.0000 | ORAL_TABLET | Freq: Once | ORAL | 0 refills | Status: AC

## 2022-06-17 NOTE — Progress Notes (Signed)
Pt's previsit is done over the phone and all paperwork (prep instructions, blank consent form to just read over) sent to patient via [place].   Pt's name and DOB verified at the beginning of the previsit.  Pt denies any difficulty with ambulating.    No trouble with anesthesia, denies being told they were difficult to intubate, or hx/fam hx of malignant hyperthermia per pt  Pt instructed to stop Xarelto 2 days prior to procedure.   No egg or soy allergy  No home oxygen use   No medications for weight loss taken  Pt denies constipation issues  Pt informed that we do not do prior authorizations for prep

## 2022-06-29 ENCOUNTER — Other Ambulatory Visit: Payer: Self-pay | Admitting: Family Medicine

## 2022-07-01 ENCOUNTER — Encounter: Payer: Self-pay | Admitting: Gastroenterology

## 2022-07-01 ENCOUNTER — Ambulatory Visit (AMBULATORY_SURGERY_CENTER): Payer: BC Managed Care – PPO | Admitting: Gastroenterology

## 2022-07-01 VITALS — BP 124/85 | HR 73 | Temp 97.3°F | Resp 16 | Ht 70.0 in | Wt 285.0 lb

## 2022-07-01 DIAGNOSIS — D123 Benign neoplasm of transverse colon: Secondary | ICD-10-CM

## 2022-07-01 DIAGNOSIS — D120 Benign neoplasm of cecum: Secondary | ICD-10-CM

## 2022-07-01 DIAGNOSIS — K635 Polyp of colon: Secondary | ICD-10-CM | POA: Diagnosis not present

## 2022-07-01 DIAGNOSIS — Z86010 Personal history of colonic polyps: Secondary | ICD-10-CM

## 2022-07-01 DIAGNOSIS — Z09 Encounter for follow-up examination after completed treatment for conditions other than malignant neoplasm: Secondary | ICD-10-CM | POA: Diagnosis not present

## 2022-07-01 DIAGNOSIS — Z1211 Encounter for screening for malignant neoplasm of colon: Secondary | ICD-10-CM | POA: Diagnosis not present

## 2022-07-01 DIAGNOSIS — D122 Benign neoplasm of ascending colon: Secondary | ICD-10-CM

## 2022-07-01 MED ORDER — SODIUM CHLORIDE 0.9 % IV SOLN: 500.0000 mL | Freq: Once | INTRAVENOUS | Status: DC

## 2022-07-01 NOTE — Patient Instructions (Signed)
HANDOUTS ON POLYPS AND HEMORRHOIDS GIVEN.   RESUME XARELTO TOMORROW.   YOU HAD AN ENDOSCOPIC PROCEDURE TODAY AT [facility]:   Refer to the procedure report that was given to you for any specific questions about what was found during the examination.  If the procedure report does not answer your questions, please call your gastroenterologist to clarify.  If you requested that your care partner not be given the details of your procedure findings, then the procedure report has been included in a sealed envelope for you to review at your convenience later.  YOU SHOULD EXPECT: Some feelings of bloating in the abdomen. Passage of more gas than usual.  Walking can help get rid of the air that was put into your GI tract during the procedure and reduce the bloating. If you had a lower endoscopy (such as a colonoscopy or flexible sigmoidoscopy) you may notice spotting of blood in your stool or on the toilet paper. If you underwent a bowel prep for your procedure, you may not have a normal bowel movement for a few days.  Please Note:  You might notice some irritation and congestion in your nose or some drainage.  This is from the oxygen used during your procedure.  There is no need for concern and it should clear up in a day or so.  SYMPTOMS TO REPORT IMMEDIATELY:  Following lower endoscopy (colonoscopy or flexible sigmoidoscopy):  Excessive amounts of blood in the stool  Significant tenderness or worsening of abdominal pains  Swelling of the abdomen that is new, acute  Fever of 100F or higher  For urgent or emergent issues, a gastroenterologist can be reached at any hour by calling [phone]. Do not use MyChart messaging for urgent concerns.    DIET:  We do recommend a small meal at first, but then you may proceed to your regular diet.  Drink plenty of fluids but you should avoid alcoholic beverages for 24 hours.  ACTIVITY:  You should plan to take it easy for the rest of today  and you should NOT DRIVE or use heavy machinery until tomorrow (because of the sedation medicines used during the test).    FOLLOW UP: Our staff will call the number listed on your records the next business day following your procedure.  We will call around 7:15- 8:00 am to check on you and address any questions or concerns that you may have regarding the information given to you following your procedure. If we do not reach you, we will leave a message.     If any biopsies were taken you will be contacted by phone or by letter within the next 1-3 weeks.  Please call us at [phone] if you have not heard about the biopsies in 3 weeks.    SIGNATURES/CONFIDENTIALITY: You and/or your care partner have signed paperwork which will be entered into your electronic medical record.  These signatures attest to the fact that that the information above on your After Visit Summary has been reviewed and is understood.  Full responsibility of the confidentiality of this discharge information lies with you and/or your care-partner.

## 2022-07-01 NOTE — Op Note (Signed)
[place] Patient Name: Jonathon [name] Procedure Date: 07/01/2022 12:05 PM MRN: [id number] Endoscopist: Remo Lipps P. Havery Moros , MD Age: 56 Referring MD:  Date of Birth: July 22, 1966 Gender: Male Account #: [id number] Procedure:                Colonoscopy Indications:              High risk colon cancer surveillance: Personal                            history of colonic polyps - 8 polyps remoed 10/2018 Medicines:                Monitored Anesthesia Care Procedure:                Pre-Anesthesia Assessment:                           - Prior to the procedure, a History and Physical                            was performed, and patient medications and                            allergies were reviewed. The patient's tolerance of                            previous anesthesia was also reviewed. The risks                            and benefits of the procedure and the sedation                            options and risks were discussed with the patient.                            All questions were answered, and informed consent                            was obtained. Prior Anticoagulants: The patient has                            taken Xarelto (rivaroxaban), last dose was 2 days                            prior to procedure. ASA Grade Assessment: III - A                            patient with severe systemic disease. After                            reviewing the risks and benefits, the patient was                            deemed in satisfactory condition to undergo the  procedure.                           After obtaining informed consent, the colonoscope                            was passed under direct vision. Throughout the                            procedure, the patient's blood pressure, pulse, and                            oxygen saturations were monitored continuously. The                            CF [id number] #[id number] was introduced  through the anus                            and advanced to the the cecum, identified by                            appendiceal orifice and ileocecal valve. The                            colonoscopy was performed without difficulty. The                            patient tolerated the procedure well. The quality                            of the bowel preparation was adequate. The                            ileocecal valve, appendiceal orifice, and rectum                            were photographed. Scope In: 12:29:25 PM Scope Out: 12:52:00 PM Scope Withdrawal Time: 0 hours 18 minutes 5 seconds  Total Procedure Duration: 0 hours 22 minutes 35 seconds  Findings:                 The perianal and digital rectal examinations were                            normal.                           A 3 to 4 mm polyp was found in the cecum. The polyp                            was flat. The polyp was removed with a cold snare.                            Resection and retrieval were complete.  Two sessile polyps were found in the ascending                            colon. The polyps were 3 to 4 mm in size. These                            polyps were removed with a cold snare. Resection                            and retrieval were complete.                           A 5 mm polyp was found in the hepatic flexure. The                            polyp was sessile. The polyp was removed with a                            cold snare. Resection and retrieval were complete.                           A diminutive polyp was found in the transverse                            colon. The polyp was sessile. The polyp was removed                            with a cold snare. Resection and retrieval were                            complete.                           Anal papilla(e) were hypertrophied. Biopsies were                            taken with a cold forceps for histology to rule  out                            AIN.                           Internal hemorrhoids were found during retroflexion.                           The exam was otherwise without abnormality. Complications:            No immediate complications. Estimated blood loss:                            Minimal. Estimated Blood Loss:     Estimated blood loss was minimal. Impression:               - One 3 to 4 mm polyp in the cecum, removed with a  cold snare. Resected and retrieved.                           - Two 3 to 4 mm polyps in the ascending colon,                            removed with a cold snare. Resected and retrieved.                           - One 5 mm polyp at the hepatic flexure, removed                            with a cold snare. Resected and retrieved.                           - One diminutive polyp in the transverse colon,                            removed with a cold snare. Resected and retrieved.                           - Anal papilla(e) were hypertrophied. Biopsied.                           - Internal hemorrhoids.                           - The examination was otherwise normal. Recommendation:           - Patient has a contact number available for                            emergencies. The signs and symptoms of potential                            delayed complications were discussed with the                            patient. Return to normal activities tomorrow.                            Written discharge instructions were provided to the                            patient.                           - Resume previous diet.                           - Continue present medications.                           - Resume Xarelto tomorrow                           -  Await pathology results. Remo Lipps P. Jaylise Peek, MD 07/01/2022 12:57:52 PM This report has been signed electronically.

## 2022-07-01 NOTE — Progress Notes (Signed)
Called to room to assist during endoscopic procedure.  Patient ID and intended procedure confirmed with present staff. Received instructions for my participation in the procedure from the performing physician.  

## 2022-07-01 NOTE — Progress Notes (Signed)
Pt's states no medical or surgical changes since previsit or office visit. 

## 2022-07-01 NOTE — Progress Notes (Signed)
[facility] History and Physical   Primary Care Physician:  Marin Olp, MD   Reason for Procedure:   History of colon polyps  Plan:    colonoscopy     HPI: Jonathon [name] is a 56 y.o. male  here for colonoscopy surveillance - 8 polyps removed 10/2018. Patient denies any bowel symptoms at this time. No family history of colon cancer known. Otherwise feels well without any cardiopulmonary symptoms. History of DVT, off Xarelto for 2 days for this exam  I have discussed risks / benefits of anesthesia and endoscopic procedure with Hermina Staggers Koon and they wish to proceed with the exams as outlined today.    Past Medical History:  Diagnosis Date   Cancer [facility])    prostate   Chicken pox    Gout    Hypertension    Left leg DVT ([place]) 07/30/2016   Peripheral vascular disease ([place])    Pulmonary embolism ([place]) 07/18/2016   Sleep apnea    no CPAP    Past Surgical History:  Procedure Laterality Date   APPENDECTOMY  2003   CHOLECYSTECTOMY  2002   COLONOSCOPY     HERNIA REPAIR     as child- unsure location   LASIK     similar to but not lasik per patient   LYMPHADENECTOMY Bilateral 08/01/2021   Procedure: LYMPHADENECTOMY, PELVIC;  Surgeon: Raynelle Bring, MD;  Location: [facility]S;  Service: Urology;  Laterality: Bilateral;   NASAL SEPTUM SURGERY  1996   ROBOT ASSISTED LAPAROSCOPIC RADICAL PROSTATECTOMY N/A 08/01/2021   Procedure: XI ROBOTIC ASSISTED LAPAROSCOPIC RADICAL PROSTATECTOMY LEVEL 2;  Surgeon: Raynelle Bring, MD;  Location: [facility];  Service: Urology;  Laterality: N/A;   WISDOM TOOTH EXTRACTION      Prior to Admission medications   Medication Sig Start Date End Date Taking? Authorizing Provider  allopurinol (ZYLOPRIM) 300 MG tablet TAKE 1 TABLET BY MOUTH EVERY DAY 06/04/22  Yes Marin Olp, MD  chlorthalidone (HYGROTON) 50 MG tablet Take 0.5 tablets (25 mg total) by mouth daily. 02/06/22  Yes Marin Olp, MD  colchicine 0.6 MG tablet  Take 2 tablets at first sign of gout flare repeat in 1 hour if not improved (max 3 in first day) then max 1 per day on following days until resolved 03/14/22  Yes Marin Olp, MD  sildenafil (VIAGRA) 100 MG tablet TAKE AS DIRECTED AS NEEDED 12/06/21  Yes [provider]  XARELTO 20 MG TABS tablet TAKE 1 TABLET BY MOUTH DAILY WITH SUPPER 06/30/22  Yes Marin Olp, MD  amLODipine (NORVASC) 10 MG tablet Take 1 tablet (10 mg total) by mouth daily. 06/18/21 06/17/22  Marin Olp, MD  mupirocin ointment (BACTROBAN) 2 % APPLY TWICE DAILY AS NEEDED TO BELLY BUTTON Patient not taking: Reported on 07/01/2022 10/29/20   Marin Olp, MD    Current Outpatient Medications  Medication Sig Dispense Refill   allopurinol (ZYLOPRIM) 300 MG tablet TAKE 1 TABLET BY MOUTH EVERY DAY 90 tablet 3   chlorthalidone (HYGROTON) 50 MG tablet Take 0.5 tablets (25 mg total) by mouth daily. 90 tablet 3   colchicine 0.6 MG tablet Take 2 tablets at first sign of gout flare repeat in 1 hour if not improved (max 3 in first day) then max 1 per day on following days until resolved 30 tablet 1   sildenafil (VIAGRA) 100 MG tablet TAKE AS DIRECTED AS NEEDED     XARELTO 20 MG TABS tablet TAKE 1  TABLET BY MOUTH DAILY WITH SUPPER 30 tablet 0   amLODipine (NORVASC) 10 MG tablet Take 1 tablet (10 mg total) by mouth daily. 90 tablet 3   mupirocin ointment (BACTROBAN) 2 % APPLY TWICE DAILY AS NEEDED TO BELLY BUTTON (Patient not taking: Reported on 07/01/2022) 22 g 1   Current Facility-Administered Medications  Medication Dose Route Frequency Provider Last Rate Last Admin   0.9 %  sodium chloride infusion  500 mL Intravenous Once Anselmo Reihl, Carlota Raspberry, MD        Allergies as of 07/01/2022   (No Known Allergies)    Family History  Problem Relation Age of Onset   Heart attack Father 26   Hypercalcemia Father    Diabetes Father    Hypertension Father    Uterine cancer Mother    Stroke Paternal Uncle    Alcohol  abuse Paternal Grandfather    Other Sister        12 years older   Brain cancer Brother        glioblastoma- 35 years older   Other Brother        33 years older   Colon cancer Neg Hx    Colon polyps Neg Hx    Esophageal cancer Neg Hx    Rectal cancer Neg Hx    Stomach cancer Neg Hx     Social History   Socioeconomic History   Marital status: Divorced    Spouse name: Not on file   Number of children: Not on file   Years of education: Not on file   Highest education level: Not on file  Occupational History   Not on file  Tobacco Use   Smoking status: Never   Smokeless tobacco: Never  Vaping Use   Vaping Use: Never used  Substance and Sexual Activity   Alcohol use: Yes    Alcohol/week: 5.0 - 6.0 standard drinks of alcohol    Types: 5 - 6 Cans of beer per week    Comment: weekly   Drug use: No   Sexual activity: Not on file  Other Topics Concern   Not on file  Social History Narrative   Divorced. Daughter Elmyra Ricks (my patient). Lives with son matthew 45 in 2017. Also has family friend going through divorce living with him.       [employer].    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  Physical Exam: Vital signs BP (!) 130/93   Pulse 83   Temp (!) 97.3 F (36.3 C) (Temporal)   Ht '5\' 10"'$  (1.778 m)   Wt 285 lb (129.3 kg)   SpO2 95%   BMI 40.89 kg/m   General:   Alert,  Well-developed, pleasant and cooperative in NAD Lungs:  Clear throughout to auscultation.   Heart:  Regular rate and rhythm Abdomen:  Soft, nontender and nondistended.   Neuro/Psych:  Alert and  cooperative. Normal mood and affect. A and O x 3  Jolly Mango, MD [facility]

## 2022-07-01 NOTE — Progress Notes (Signed)
Sedate, gd SR, tolerated procedure well, VSS, report to RN 

## 2022-07-02 ENCOUNTER — Telehealth: Payer: Self-pay | Admitting: *Deleted

## 2022-07-02 NOTE — Telephone Encounter (Signed)
  Follow up Call-     07/01/2022   11:18 AM  Call back number  Post procedure Call Back phone  # [phone]  Permission to leave phone message Yes     Patient questions:   Message left to call us if necessary.

## 2022-07-07 ENCOUNTER — Other Ambulatory Visit: Payer: Self-pay | Admitting: Family Medicine

## 2022-07-07 ENCOUNTER — Ambulatory Visit: Payer: BC Managed Care – PPO | Admitting: Family Medicine

## 2022-07-07 ENCOUNTER — Encounter: Payer: Self-pay | Admitting: *Deleted

## 2022-07-14 ENCOUNTER — Encounter: Payer: Self-pay | Admitting: Family Medicine

## 2022-07-14 ENCOUNTER — Ambulatory Visit: Payer: BC Managed Care – PPO | Admitting: Family Medicine

## 2022-07-14 VITALS — BP 140/90 | HR 76 | Temp 98.2°F | Ht 70.0 in | Wt 284.0 lb

## 2022-07-14 DIAGNOSIS — M1009 Idiopathic gout, multiple sites: Secondary | ICD-10-CM | POA: Diagnosis not present

## 2022-07-14 DIAGNOSIS — I10 Essential (primary) hypertension: Secondary | ICD-10-CM | POA: Diagnosis not present

## 2022-07-14 MED ORDER — RIVAROXABAN 20 MG PO TABS: 20.0000 mg | ORAL_TABLET | Freq: Every day | ORAL | 5 refills | Status: DC

## 2022-07-14 MED ORDER — VALSARTAN 80 MG PO TABS: 80.0000 mg | ORAL_TABLET | Freq: Every day | ORAL | 3 refills | Status: DC

## 2022-07-14 NOTE — Progress Notes (Signed)
Phone [phone] In person visit   Subjective:   Jonathon [name] is a 56 y.o. year old very pleasant male patient who presents for/with See problem oriented charting Chief Complaint  Patient presents with   Follow-up   Hypertension   Past Medical History-  Patient Active Problem List   Diagnosis Date Noted   Malignant neoplasm of prostate ([place]) 04/23/2021    Priority: High   History of DVT (deep vein thrombosis) 07/30/2016    Priority: High   History of pulmonary embolism 07/18/2016    Priority: High   Gout 07/30/2016    Priority: Medium    OSA (obstructive sleep apnea) 07/30/2016    Priority: Medium    Essential hypertension 07/30/2016    Priority: Medium    Traumatic bursitis 11/12/2017    Priority: Low    Medications- reviewed and updated Current Outpatient Medications  Medication Sig Dispense Refill   allopurinol (ZYLOPRIM) 300 MG tablet TAKE 1 TABLET BY MOUTH EVERY DAY 90 tablet 3   colchicine 0.6 MG tablet Take 2 tablets at first sign of gout flare repeat in 1 hour if not improved (max 3 in first day) then max 1 per day on following days until resolved 30 tablet 1   mupirocin ointment (BACTROBAN) 2 % APPLY TWICE DAILY AS NEEDED TO BELLY BUTTON 22 g 1   sildenafil (VIAGRA) 100 MG tablet TAKE AS DIRECTED AS NEEDED     valsartan (DIOVAN) 80 MG tablet Take 1 tablet (80 mg total) by mouth daily. 90 tablet 3   amLODipine (NORVASC) 10 MG tablet TAKE 1 TABLET BY MOUTH EVERY DAY (Patient not taking: Reported on 07/14/2022) 90 tablet 3   chlorthalidone (HYGROTON) 50 MG tablet Take 0.5 tablets (25 mg total) by mouth daily. (Patient not taking: Reported on 07/14/2022) 90 tablet 3   rivaroxaban (XARELTO) 20 MG TABS tablet Take 1 tablet (20 mg total) by mouth daily with supper. 30 tablet 5   No current facility-administered medications for this visit.     Objective:  BP (!) 140/90   Pulse 76   Temp 98.2 F (36.8 C)   Ht '5\' 10"'$  (1.778 m)   Wt 284 lb (128.8 kg)   SpO2  95%   BMI 40.75 kg/m  Gen: NAD, resting comfortably CV: RRR no murmurs rubs or gallops Lungs: CTAB no crackles, wheeze, rhonchi    Assessment and Plan    # Essential Hypertension S: medication: off all meds -chlorthalidone 25 mg (thought was only diuretic if taking amlodipine) and amlodipine 10 mg daily (causes swelling so confused possible DVT issues and opted to pull off- edema improved off meds) Home readings #s: not checking at present- wrist cuff- plans on getting  -twice a week exercise . Trying to limit salt and watch diet- down 14 lbs from last visit BP Readings from Last 3 Encounters:  07/14/22 (!) 140/90  07/01/22 124/85  05/16/22 139/84  A/P: blood pressure slightly high- but honestly I'm quite pleased that its not more elevated off of amlodipine and chlorthalidone- you've done a nice job on weight loss! Lets try lower dose valsartan 80 mg and continue your efforts. Come back in 2 weeks for lab visit to recheck kidney function and potassium. Also update me in a month or so with home readings - wants to avoid diuretic as first choice (but doesn't mind as add on given gout concerns) and also with amlodipine causing edema wants to hold off  #Gout S: 0  flares recently on allopurinol  300 mg in the AM daily.  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  On aspirin alone per Dr. Marin Olp who had released patient but later had recurrence and at least for now in 2023 we have opted to continue xarelto 20 mg -venous duplex 05/19/22 with minimal nonocclusive chronic thrombus noted in distal femoral and popliteal veins A/P: has upcoming follow up with Dr. Marin Olp but for now refill xarelto 20 mg to prevent recurrence or worsening of current  clot  Recommended follow up: Return in about 6 months (around 01/13/2023) for physical or sooner if needed.Schedule b4 you leave. Future Appointments  Date Time Provider [place]  07/28/2022  8:30 AM [facility]  08/21/2022 10:15 AM [facility] None  08/21/2022 10:30 AM Ennever, Rudell Cobb, MD [facility] None  01/13/2023 10:00 AM Marin Olp, MD [facility]   Lab/Order associations:   ICD-10-CM   1. Essential hypertension  P23 Basic metabolic panel    2. Idiopathic gout of multiple sites, unspecified chronicity  M10.09       Meds ordered this encounter  Medications   rivaroxaban (XARELTO) 20 MG TABS tablet    Sig: Take 1 tablet (20 mg total) by mouth daily with supper.    Dispense:  30 tablet    Refill:  5   valsartan (DIOVAN) 80 MG tablet    Sig: Take 1 tablet (80 mg total) by mouth daily.    Dispense:  90 tablet    Refill:  3    Return precautions advised.  Garret Reddish, MD

## 2022-07-14 NOTE — Assessment & Plan Note (Signed)
S: medication: off all meds -chlorthalidone 25 mg (thought was only diuretic if taking amlodipine) and amlodipine 10 mg daily (causes swelling so confused possible DVT issues and opted to pull off- edema improved off meds) Home readings #s: not checking at present- wrist cuff- plans on getting  -twice a week exercise . Trying to limit salt and watch diet- down 14 lbs from last visit BP Readings from Last 3 Encounters:  07/14/22 (!) 140/90  07/01/22 124/85  05/16/22 139/84  A/P: blood pressure slightly high- but honestly I'm quite pleased that its not more elevated off of amlodipine and chlorthalidone- you've done a nice job on weight loss! Lets try lower dose valsartan 80 mg and continue your efforts. Come back in 2 weeks for lab visit to recheck kidney function and potassium. Also update me in a month or so with home readings - wants to avoid diuretic as first choice (but doesn't mind as add on given gout concerns) and also with amlodipine causing edema wants to hold off

## 2022-07-14 NOTE — Patient Instructions (Addendum)
OMRON 3 Series Blood Pressure Monitor ([id number]), Upper Arm Cuff, Digital Blood Pressure Machine, Stores Up To 14 -[employer] tag should be around 35 -if trouble finding I can send a link to you  blood pressure slightly high- but honestly I'm quite pleased that its not more elevated off of amlodipine and chlorthalidone- you've done a nice job on weight loss! Lets try lower dose valsartan 80 mg and continue your efforts. Come back in 2 weeks for lab visit to recheck kidney function and potassium. Also update me in a month or so with home readings  Recommended follow up: Return in about 6 months (around 01/13/2023) for physical or sooner if needed.Schedule b4 you leave.

## 2022-07-28 ENCOUNTER — Other Ambulatory Visit (INDEPENDENT_AMBULATORY_CARE_PROVIDER_SITE_OTHER): Payer: BC Managed Care – PPO

## 2022-07-28 DIAGNOSIS — I10 Essential (primary) hypertension: Secondary | ICD-10-CM | POA: Diagnosis not present

## 2022-07-28 LAB — BASIC METABOLIC PANEL
BUN: 14 mg/dL (ref 6–23)
CO2: 29 mEq/L (ref 19–32)
Calcium: 9.1 mg/dL (ref 8.4–10.5)
Chloride: 105 mEq/L (ref 96–112)
Creatinine, Ser: 1.1 mg/dL (ref 0.40–1.50)
GFR: 75.19 mL/min (ref >60.00)
Glucose, Bld: 106 mg/dL — ABNORMAL HIGH (ref 70–99)
Potassium: 4 mEq/L (ref 3.5–5.1)
Sodium: 141 mEq/L (ref 135–145)

## 2022-08-01 ENCOUNTER — Encounter: Payer: Self-pay | Admitting: Family Medicine

## 2022-08-04 MED ORDER — MUPIROCIN 2 % EX OINT: TOPICAL_OINTMENT | CUTANEOUS | 1 refills | Status: AC

## 2022-08-21 ENCOUNTER — Inpatient Hospital Stay: Payer: BC Managed Care – PPO

## 2022-08-21 ENCOUNTER — Inpatient Hospital Stay: Payer: BC Managed Care – PPO | Admitting: Hematology & Oncology

## 2022-10-06 ENCOUNTER — Encounter: Payer: Self-pay | Admitting: Family Medicine

## 2022-10-07 NOTE — Telephone Encounter (Signed)
Can visit be with another provider? PCP isn't available until 10/24/22.

## 2022-10-17 DIAGNOSIS — H40043 Steroid responder, bilateral: Secondary | ICD-10-CM | POA: Diagnosis not present

## 2023-01-13 ENCOUNTER — Ambulatory Visit (INDEPENDENT_AMBULATORY_CARE_PROVIDER_SITE_OTHER): Payer: BC Managed Care – PPO | Admitting: Family Medicine

## 2023-01-13 ENCOUNTER — Encounter: Payer: Self-pay | Admitting: Family Medicine

## 2023-01-13 VITALS — BP 128/86 | HR 83 | Temp 98.0°F | Ht 70.0 in | Wt 294.4 lb

## 2023-01-13 DIAGNOSIS — I10 Essential (primary) hypertension: Secondary | ICD-10-CM | POA: Diagnosis not present

## 2023-01-13 DIAGNOSIS — M1009 Idiopathic gout, multiple sites: Secondary | ICD-10-CM | POA: Diagnosis not present

## 2023-01-13 DIAGNOSIS — C61 Malignant neoplasm of prostate: Secondary | ICD-10-CM

## 2023-01-13 DIAGNOSIS — Z0000 Encounter for general adult medical examination without abnormal findings: Secondary | ICD-10-CM | POA: Diagnosis not present

## 2023-01-13 DIAGNOSIS — I82412 Acute embolism and thrombosis of left femoral vein: Secondary | ICD-10-CM

## 2023-01-13 DIAGNOSIS — Z131 Encounter for screening for diabetes mellitus: Secondary | ICD-10-CM | POA: Diagnosis not present

## 2023-01-13 LAB — URIC ACID: Uric Acid, Serum: 6.1 mg/dL (ref 4.0–7.8)

## 2023-01-13 LAB — COMPREHENSIVE METABOLIC PANEL
ALT: 24 U/L (ref 0–53)
AST: 26 U/L (ref 0–37)
Albumin: 4.6 g/dL (ref 3.5–5.2)
Alkaline Phosphatase: 91 U/L (ref 39–117)
BUN: 19 mg/dL (ref 6–23)
CO2: 29 mEq/L (ref 19–32)
Calcium: 9.7 mg/dL (ref 8.4–10.5)
Chloride: 103 mEq/L (ref 96–112)
Creatinine, Ser: 1.08 mg/dL (ref 0.40–1.50)
GFR: 76.62 mL/min (ref >60.00)
Glucose, Bld: 90 mg/dL (ref 70–99)
Potassium: 4.5 mEq/L (ref 3.5–5.1)
Sodium: 138 mEq/L (ref 135–145)
Total Bilirubin: 0.7 mg/dL (ref 0.2–1.2)
Total Protein: 7 g/dL (ref 6.0–8.3)

## 2023-01-13 LAB — CBC WITH DIFFERENTIAL/PLATELET
Basophils Absolute: 0 10*3/uL (ref 0.0–0.1)
Basophils Relative: 0.6 % (ref 0.0–3.0)
Eosinophils Absolute: 0.3 10*3/uL (ref 0.0–0.7)
Eosinophils Relative: 5.5 % — ABNORMAL HIGH (ref 0.0–5.0)
HCT: 46.2 % (ref 39.0–52.0)
Hemoglobin: 15.5 g/dL (ref 13.0–17.0)
Lymphocytes Relative: 30.7 % (ref 12.0–46.0)
Lymphs Abs: 1.6 10*3/uL (ref 0.7–4.0)
MCHC: 33.5 g/dL (ref 30.0–36.0)
MCV: 91.6 fl (ref 78.0–100.0)
Monocytes Absolute: 0.5 10*3/uL (ref 0.1–1.0)
Monocytes Relative: 10.2 % (ref 3.0–12.0)
Neutro Abs: 2.8 10*3/uL (ref 1.4–7.7)
Neutrophils Relative %: 53 % (ref 43.0–77.0)
Platelets: 206 10*3/uL (ref 150.0–400.0)
RBC: 5.05 Mil/uL (ref 4.22–5.81)
RDW: 13.7 % (ref 11.5–15.5)
WBC: 5.2 10*3/uL (ref 4.0–10.5)

## 2023-01-13 LAB — HEMOGLOBIN A1C: Hgb A1c MFr Bld: 5.6 % (ref 4.6–6.5)

## 2023-01-13 LAB — LIPID PANEL
Cholesterol: 165 mg/dL (ref 0–200)
HDL: 40.3 mg/dL (ref >39.00)
LDL Cholesterol: 93 mg/dL (ref 0–99)
NonHDL: 124.46
Total CHOL/HDL Ratio: 4
Triglycerides: 155 mg/dL — ABNORMAL HIGH (ref 0.0–149.0)
VLDL: 31 mg/dL (ref 0.0–40.0)

## 2023-01-13 MED ORDER — RIVAROXABAN 20 MG PO TABS: 20.0000 mg | ORAL_TABLET | Freq: Every day | ORAL | 5 refills | Status: DC

## 2023-01-13 MED ORDER — CYCLOBENZAPRINE HCL 5 MG PO TABS: 5.0000 mg | ORAL_TABLET | Freq: Three times a day (TID) | ORAL | 1 refills | Status: DC | PRN

## 2023-01-13 NOTE — Progress Notes (Signed)
Phone: [phone]   Subjective:  Patient presents today for their annual physical. Chief complaint-noted.   See problem oriented charting- ROS- full  review of systems was completed and negative  Per full ROS sheet completed by patient  The following were reviewed and entered/updated in epic: Past Medical History:  Diagnosis Date   Cancer    prostate   Chicken pox    Gout    Hypertension    Left leg DVT 07/30/2016   Peripheral vascular disease    Pulmonary embolism 07/18/2016   Sleep apnea    no CPAP   Patient Active Problem List   Diagnosis Date Noted   Malignant neoplasm of prostate 04/23/2021    Priority: High   Chronic deep vein thrombosis (DVT) 07/30/2016    Priority: High   History of pulmonary embolism 07/18/2016    Priority: High   Gout 07/30/2016    Priority: Medium    OSA (obstructive sleep apnea) 07/30/2016    Priority: Medium    Essential hypertension 07/30/2016    Priority: Medium    Traumatic bursitis 11/12/2017    Priority: Low   Past Surgical History:  Procedure Laterality Date   APPENDECTOMY  2003   CHOLECYSTECTOMY  2002   COLONOSCOPY     HERNIA REPAIR     as child- unsure location   LASIK     similar to but not lasik per patient   LYMPHADENECTOMY Bilateral 08/01/2021   Procedure: LYMPHADENECTOMY, PELVIC;  Surgeon: Raynelle Bring, MD;  Location: [facility];  Service: Urology;  Laterality: Bilateral;   NASAL SEPTUM SURGERY  1996   ROBOT ASSISTED LAPAROSCOPIC RADICAL PROSTATECTOMY N/A 08/01/2021   Procedure: XI ROBOTIC ASSISTED LAPAROSCOPIC RADICAL PROSTATECTOMY LEVEL 2;  Surgeon: Raynelle Bring, MD;  Location: [facility];  Service: Urology;  Laterality: N/A;   WISDOM TOOTH EXTRACTION      Family History  Problem Relation Age of Onset   Heart attack Father 63   Hypercalcemia Father    Diabetes Father    Hypertension Father    Uterine cancer Mother    Stroke Paternal Uncle    Alcohol abuse Paternal Grandfather    Other Sister        71 years  older   Brain cancer Brother        glioblastoma- 34 years older   Other Brother        15 years older   Colon cancer Neg Hx    Colon polyps Neg Hx    Esophageal cancer Neg Hx    Rectal cancer Neg Hx    Stomach cancer Neg Hx     Medications- reviewed and updated Current Outpatient Medications  Medication Sig Dispense Refill   allopurinol (ZYLOPRIM) 300 MG tablet TAKE 1 TABLET BY MOUTH EVERY DAY 90 tablet 3   cyclobenzaprine (FLEXERIL) 5 MG tablet Take 1 tablet (5 mg total) by mouth 3 (three) times daily as needed for muscle spasms (in the low back. do not drive for 8 hours after taking). 30 tablet 1   sildenafil (VIAGRA) 100 MG tablet TAKE AS DIRECTED AS NEEDED     valsartan (DIOVAN) 80 MG tablet Take 1 tablet (80 mg total) by mouth daily. 90 tablet 3   colchicine 0.6 MG tablet Take 2 tablets at first sign of gout flare repeat in 1 hour if not improved (max 3 in first day) then max 1 per day on following days until resolved (Patient not taking: Reported on 01/13/2023) 30 tablet 1   mupirocin  ointment (BACTROBAN) 2 % APPLY TWICE DAILY AS NEEDED TO BELLY BUTTON (Patient not taking: Reported on 01/13/2023) 22 g 1   rivaroxaban (XARELTO) 20 MG TABS tablet Take 1 tablet (20 mg total) by mouth daily with supper. 30 tablet 5   No current facility-administered medications for this visit.    Allergies-reviewed and updated No Known Allergies  Social History   Social History Narrative   Long term girlfriend. Divorced. Daughter Elmyra Ricks (my patient) lives in [place]. Son Houston in [place] in 2024 working hvac.       [employer] for [employer].    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 Oropharynx normal Neck: no thyromegaly CV: RRR no murmurs  rubs or gallops Lungs: CTAB no crackles, wheeze, rhonchi Abdomen: soft/nontender/nondistended/normal bowel sounds. No rebound or guarding.  Ext: trace edema Skin: warm, dry Neuro: grossly normal, moves all extremities, PERRLA    Assessment and Plan  57 y.o. male presenting for annual physical.  Health Maintenance counseling: 1. Anticipatory guidance: Patient counseled regarding regular dental exams -q6 months, eye exams -yearly,  avoiding smoking and second hand smoke , limiting alcohol to 2 beverages per day - 2 per week, no illicit drugs.   2. Risk factor reduction:  Advised patient of need for regular exercise and diet rich and fruits and vegetables to reduce risk of heart attack and stroke.  Exercise- still going twice a week to gym- does some sit ups but not as much for back for core- discussed planking as an option.  Diet/weight management-down 4 lbs in last year but had lost as much as 40 on home scales- wants to continue efforts to bring this down.  Wt Readings from Last 3 Encounters:  01/13/23 294 lb 6.4 oz (133.5 kg)  07/14/22 284 lb (128.8 kg)  07/01/22 285 lb (129.3 kg)  3. Immunizations/screenings/ancillary studies- opts out of COVID otherwise up to date  Immunization History  Administered Date(s) Administered   PFIZER(Purple Top)SARS-COV-2 Vaccination 01/05/2020, 01/30/2020, 09/26/2020   Tdap 02/05/2017   Zoster Recombinat (Shingrix) 06/18/2021, 11/07/2021  4. Prostate cancer screening-   see below about prostate cancer   5. Colon cancer screening - History of colon polyps-followed by Dr. Armbruster-07/07/2022 "The five polyps that I removed from your colon were proven to be adenomatous and sessile serrated... next colonoscopy in 3 years." 6. Skin cancer screening- refer last year and no concerns- was not advised regular follow up. advised regular sunscreen use. Denies worrisome, changing, or new skin lesions.  7. Smoking associated screening (lung cancer screening, AAA  screen 65-75, UA)- never smoker 8. STD screening - monogamous with long term girlfriend   Status of chronic or acute concerns   #Prostate cancer S:  robotic assisted prostatectomy in October on the 20th with Dr. Alinda Money.  He was referred due to with elevated PSA-had been managed at [facility] and was originally diagnosed 04/17/2021 A/P: PSA was undetectable on last check and has upcoming check with thema neurological deficit(s) follow up visit- defer PSA for today    # Essential Hypertension S: medication: valsartan 80mg  -prior chlorthalidone 25and amlodipine 10 mg daily BP Readings from Last 3 Encounters:  01/13/23 128/86  07/14/22 Marland Kitchen)  140/90  07/01/22 124/85  A/P: blood pressure high acceptable range today- wants to continue to work on healthy eating and regular exercise and weight loss over increasing meds  #Gout S: medication: allopurinol 300 mg in the AM daily.  -Has colchiciine on hand- have not had to use Lab Results  Component Value Date   LABURIC 8.1 (H) 12/26/2021  A/P:update uric acid- with no flares unlikely to change medications - continue current medications for now   #hyperlipidemia S: Medication:none  Lab Results  Component Value Date   CHOL 175 12/26/2021   HDL 48.50 12/26/2021   LDLCALC 102 (H) 12/26/2021   LDLDIRECT 105.0 12/17/2020   TRIG 120.0 12/26/2021   CHOLHDL 4 12/26/2021   A/P: The 10-year ASCVD risk score (Arnett DK, et al., 2019) is: 6.3% On CT angio in 2020 there were no coronary artery calcifications- discussed CT calcium scoring and we opted to consider next year and work on healthy eating and regular exercise   -dad did have MI in 70's but was smoker   # Chronic DVT/PE-  S:  Patient with history of chronic popliteal thrombosis as well as DVT/PE-used compression stockings with travel by airplane or driving over 2 hours.  On aspirin alone per Dr. Marin Olp who had released patient but he later had recurrence so we opted for lifelong therapy  with xarelto  20 mg at least until sees Dr. Marin Olp again (05/19/22 minimal nonocculisive chronic thrombus in distal femoral and popliteal veins) A/P: stable- continue current medicines - xarelto 20 mg daily to prevent worsening/recurrence of deep vein thrombosis/PULMONARY EMBOLISM    # Lower back pain  S:back "goes out" about once a year but can be very painful- happened in December and eventually improved with aleve and weight lifting belt. Several years ago hasd seen an outside provider and been given muscle relaxant and prednisone -back often will seize up after lying down for a period  A/P: no current issues- but we discussed core strengthening-lifting with the legs nad having flexeril on hand if needed - sent this in today. Aleve can raise blood pressure.   Recommended follow up: Return in about 7 months (around 08/15/2023) for followup or sooner if needed.Schedule b4 you leave.  Lab/Order associations:NOT fasting   ICD-10-CM   1. Preventative health care  Z00.00     2. Prostate cancer Chronic C61     3. Essential hypertension  I10 CBC with Differential/Platelet    Comprehensive metabolic panel    Lipid panel    4. Idiopathic gout of multiple sites, unspecified chronicity  M10.09 Uric acid    5. Screening for diabetes mellitus  Z13.1 HgB A1c    6. Morbid obesity  E66.01 HgB A1c    7. Deep vein thrombosis (DVT) of femoral vein of left lower extremity, unspecified chronicity Chronic I82.412      Meds ordered this encounter  Medications   cyclobenzaprine (FLEXERIL) 5 MG tablet    Sig: Take 1 tablet (5 mg total) by mouth 3 (three) times daily as needed for muscle spasms (in the low back. do not drive for 8 hours after taking).    Dispense:  30 tablet    Refill:  1   rivaroxaban (XARELTO) 20 MG TABS tablet    Sig: Take 1 tablet (20 mg total) by mouth daily with supper.    Dispense:  30 tablet    Refill:  5   Return precautions advised.  Garret Reddish, MD

## 2023-01-13 NOTE — Patient Instructions (Addendum)
Please stop by lab before you go If you have mychart- we will send your results within 3 business days of [place] receiving them.  If you do not have mychart- we will call you about results within 5 business days of [place] receiving them.  *please also note that you will see labs on mychart as soon as they post. I will later go in and write notes on them- will say "notes from Dr. Yong Channel"   Recommended follow up: Return in about 7 months (around 08/15/2023) for followup or sooner if needed.Schedule b4 you leave.

## 2023-01-14 DIAGNOSIS — C61 Malignant neoplasm of prostate: Secondary | ICD-10-CM | POA: Diagnosis not present

## 2023-01-21 DIAGNOSIS — C61 Malignant neoplasm of prostate: Secondary | ICD-10-CM | POA: Diagnosis not present

## 2023-01-21 DIAGNOSIS — N5201 Erectile dysfunction due to arterial insufficiency: Secondary | ICD-10-CM | POA: Diagnosis not present

## 2023-02-02 ENCOUNTER — Encounter: Payer: Self-pay | Admitting: Family Medicine

## 2023-06-14 ENCOUNTER — Other Ambulatory Visit: Payer: Self-pay | Admitting: Family Medicine

## 2023-07-04 ENCOUNTER — Other Ambulatory Visit: Payer: Self-pay | Admitting: Family Medicine

## 2023-07-09 ENCOUNTER — Other Ambulatory Visit: Payer: Self-pay | Admitting: Family Medicine

## 2023-07-15 DIAGNOSIS — C61 Malignant neoplasm of prostate: Secondary | ICD-10-CM | POA: Diagnosis not present

## 2023-07-22 DIAGNOSIS — C61 Malignant neoplasm of prostate: Secondary | ICD-10-CM | POA: Diagnosis not present

## 2023-08-12 ENCOUNTER — Other Ambulatory Visit: Payer: Self-pay | Admitting: Family Medicine

## 2023-10-23 DIAGNOSIS — H40013 Open angle with borderline findings, low risk, bilateral: Secondary | ICD-10-CM | POA: Diagnosis not present

## 2023-10-23 DIAGNOSIS — H40043 Steroid responder, bilateral: Secondary | ICD-10-CM | POA: Diagnosis not present

## 2023-12-27 ENCOUNTER — Other Ambulatory Visit: Payer: Self-pay | Admitting: Family Medicine

## 2024-02-10 ENCOUNTER — Other Ambulatory Visit: Payer: Self-pay | Admitting: Family Medicine

## 2024-02-11 ENCOUNTER — Other Ambulatory Visit: Payer: Self-pay | Admitting: Family Medicine

## 2024-02-11 NOTE — Telephone Encounter (Signed)
 Last Fill:   Last OV: Next OV:  Routing to provider for review/authorization.  Last Fill: Allopurinol : 07/09/23      Xarelto : 08/12/23  Last OV: 01/13/23 Next OV: None Scheduled  Routing to provider for review/authorization.   Copied from CRM [id number]. Topic: Clinical - Medication Refill >> Feb 11, 2024  9:54 AM Concetta Dee E wrote: Most Recent Primary Care Visit:  Provider: Almira Jaeger  Department: [facility]  Visit Type: PHYSICAL  Date: 01/13/2023  Medication: allopurinol  (ZYLOPRIM ) 300 MG tablet  XARELTO  20 MG TABS tablet  Has the patient contacted their pharmacy? Yes (Agent: If no, request that the patient contact the pharmacy for the refill. If patient does not wish to contact the pharmacy document the reason why and proceed with request.) (Agent: If yes, when and what did the pharmacy advise?)  Is this the correct pharmacy for this prescription? Yes If no, delete pharmacy and type the correct one.  This is the patient's preferred pharmacy:  [id number] - [place], [place] - [address] [place]  [address] Phone: [phone] Fax: [phone]  [facility] [address] [place] [address] Phone: [phone] Fax: [phone]  [id number] - [place], [place] - [address] [facility] [address] [place] [address] Phone: [phone] Fax: [phone]   Has the prescription been filled recently? Yes  Is the patient out of the medication? No  Has the patient been seen for an appointment in the last year OR does the patient have an upcoming appointment? Yes  Can we respond through MyChart? Yes  Agent: Please be advised that Rx refills may take up to 3 business days. We ask that you follow-up with your pharmacy.

## 2024-02-18 ENCOUNTER — Other Ambulatory Visit: Payer: Self-pay | Admitting: Family Medicine

## 2024-02-19 ENCOUNTER — Telehealth: Payer: Self-pay | Admitting: Family Medicine

## 2024-02-19 NOTE — Telephone Encounter (Signed)
 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 Time [facility] Time) Disposition Final User 02/18/2024 8:13:44 PM General Information Provided Yes [place] Final Disposition 02/18/2024 8:13:44 PM General Information Provided Yes [place]

## 2024-03-23 DIAGNOSIS — C61 Malignant neoplasm of prostate: Secondary | ICD-10-CM | POA: Diagnosis not present

## 2024-03-30 DIAGNOSIS — C61 Malignant neoplasm of prostate: Secondary | ICD-10-CM | POA: Diagnosis not present

## 2024-03-30 DIAGNOSIS — N5201 Erectile dysfunction due to arterial insufficiency: Secondary | ICD-10-CM | POA: Diagnosis not present

## 2024-05-18 ENCOUNTER — Other Ambulatory Visit: Payer: Self-pay | Admitting: Family Medicine

## 2024-06-13 ENCOUNTER — Other Ambulatory Visit: Payer: Self-pay | Admitting: Family Medicine

## 2024-06-13 DIAGNOSIS — M7989 Other specified soft tissue disorders: Secondary | ICD-10-CM | POA: Diagnosis not present

## 2024-06-13 DIAGNOSIS — I10 Essential (primary) hypertension: Secondary | ICD-10-CM | POA: Diagnosis not present

## 2024-06-13 DIAGNOSIS — T1490XA Injury, unspecified, initial encounter: Secondary | ICD-10-CM | POA: Diagnosis not present

## 2024-06-13 DIAGNOSIS — S5002XA Contusion of left elbow, initial encounter: Secondary | ICD-10-CM | POA: Diagnosis not present

## 2024-06-13 DIAGNOSIS — M25522 Pain in left elbow: Secondary | ICD-10-CM | POA: Diagnosis not present

## 2024-06-14 ENCOUNTER — Other Ambulatory Visit: Payer: Self-pay | Admitting: Family Medicine

## 2024-07-13 ENCOUNTER — Encounter: Payer: Self-pay | Admitting: Family Medicine

## 2024-07-13 ENCOUNTER — Ambulatory Visit: Payer: Self-pay

## 2024-07-13 ENCOUNTER — Ambulatory Visit: Admitting: Family Medicine

## 2024-07-13 VITALS — BP 138/82 | HR 100 | Temp 97.9°F | Ht 70.0 in | Wt 312.0 lb

## 2024-07-13 DIAGNOSIS — L918 Other hypertrophic disorders of the skin: Secondary | ICD-10-CM | POA: Diagnosis not present

## 2024-07-13 DIAGNOSIS — M7150 Other bursitis, not elsewhere classified, unspecified site: Secondary | ICD-10-CM

## 2024-07-13 DIAGNOSIS — I10 Essential (primary) hypertension: Secondary | ICD-10-CM | POA: Diagnosis not present

## 2024-07-13 DIAGNOSIS — M7022 Olecranon bursitis, left elbow: Secondary | ICD-10-CM | POA: Diagnosis not present

## 2024-07-13 DIAGNOSIS — M25522 Pain in left elbow: Secondary | ICD-10-CM | POA: Diagnosis not present

## 2024-07-13 NOTE — Progress Notes (Signed)
 Subjective:     Patient ID: Jonathon [name], male    DOB: 1965/11/29, 58 y.o.   MRN: [id number]  Chief Complaint  Patient presents with   Elbow Injury    Tripped and fell and hit Left elbow about 6wks ago; went to UC, xray showed not broken; was told it was bursitis and swelling would go down but hasn't gone down and very red, painful, and felt hot to the touch last night; feel like fluid going up arm    HPI Skin tags eyelids.  Ophth Discussed the use of AI scribe software for clinical note transcription with the patient, who gave verbal consent to proceed.  History of Present Illness Jonathon [name] is a 58 year old male with gout and hypertension who presents with left elbow swelling and pain.  Approximately six weeks ago, he tripped over a dog bowl in the laundry room and fell onto his left elbow. Initially, he sought care at an urgent care facility where x-rays were performed and were negative for fracture. He was diagnosed with bursitis. The swelling was significant, and he was unable to bend the elbow initially. The swelling in his hand subsided, but the elbow remained swollen.  Last night, the swelling in the left elbow increased significantly and became hot, raising concerns  He notes that the mobility of his arm is not as it should be. He has been taking Aleve for the pain but has not been prescribed steroids or other medications for this issue. He made appt to have it drained.  He has a history of gout and is currently taking allopurinol  daily. He also has a history of hypertension and is taking valsartan  80 mg. He reports that his blood pressure is always high at home, although it has been normal during some past visits. He has not had a recent well visit this year and has gained weight, which he attributes to his reluctance to seek medical care.  Has irrit skin tags upper eyelids-would like removed.  Derm told him they don't do eyelids    Health Maintenance Due   Topic Date Due   Hepatitis B Vaccines 19-59 Average Risk (1 of 3 - 19+ 3-dose series) Never done   Pneumococcal Vaccine: 50+ Years (1 of 1 - PCV) Never done   Influenza Vaccine  Never done    Past Medical History:  Diagnosis Date   Cancer (HCC)    prostate   Chicken pox    Gout    Hypertension    Left leg DVT (HCC) 07/30/2016   Peripheral vascular disease    Pulmonary embolism (HCC) 07/18/2016   Sleep apnea    no CPAP    Past Surgical History:  Procedure Laterality Date   APPENDECTOMY  2003   CHOLECYSTECTOMY  2002   COLONOSCOPY     HERNIA REPAIR     as child- unsure location   LASIK     similar to but not lasik per patient   LYMPHADENECTOMY Bilateral 08/01/2021   Procedure: LYMPHADENECTOMY, PELVIC;  Surgeon: Renda Glance, MD;  Location: [facility];  Service: Urology;  Laterality: Bilateral;   NASAL SEPTUM SURGERY  1996   ROBOT ASSISTED LAPAROSCOPIC RADICAL PROSTATECTOMY N/A 08/01/2021   Procedure: XI ROBOTIC ASSISTED LAPAROSCOPIC RADICAL PROSTATECTOMY LEVEL 2;  Surgeon: Renda Glance, MD;  Location: [facility];  Service: Urology;  Laterality: N/A;   WISDOM TOOTH EXTRACTION       Current Outpatient Medications:    allopurinol  (ZYLOPRIM ) 300 MG tablet,  TAKE 1 TABLET BY MOUTH EVERY DAY, Disp: 90 tablet, Rfl: 0   cyclobenzaprine  (FLEXERIL ) 5 MG tablet, TAKE 1 TABLET (5 MG TOTAL) BY MOUTH 3 (THREE) TIMES DAILY AS NEEDED FOR MUSCLE SPASMS (IN THE LOW BACK. DO NOT DRIVE FOR 8 HOURS AFTER TAKING)., Disp: 30 tablet, Rfl: 1   sildenafil (VIAGRA) 100 MG tablet, TAKE AS DIRECTED AS NEEDED, Disp: , Rfl:    valsartan  (DIOVAN ) 80 MG tablet, TAKE 1 TABLET BY MOUTH EVERY DAY, Disp: 90 tablet, Rfl: 3   XARELTO  20 MG TABS tablet, TAKE 1 TABLET BY MOUTH DAILY WITH SUPPER., Disp: 90 tablet, Rfl: 1   colchicine  0.6 MG tablet, Take 2 tablets at first sign of gout flare repeat in 1 hour if not improved (max 3 in first day) then max 1 per day on following days until resolved (Patient not taking:  Reported on 07/13/2024), Disp: 30 tablet, Rfl: 1   mupirocin  ointment (BACTROBAN ) 2 %, APPLY TWICE DAILY AS NEEDED TO BELLY BUTTON (Patient not taking: Reported on 07/13/2024), Disp: 22 g, Rfl: 1  No Known Allergies ROS neg/noncontributory except as noted HPI/below      Objective:     BP 138/82 (BP Location: Right Arm, Patient Position: Sitting, Cuff Size: Large)   Pulse 100   Temp 97.9 F (36.6 C)   Ht 5' 10 (1.778 m)   Wt (!) 312 lb (141.5 kg)   SpO2 95%   BMI 44.77 kg/m  Wt Readings from Last 3 Encounters:  07/13/24 (!) 312 lb (141.5 kg)  01/13/23 294 lb 6.4 oz (133.5 kg)  07/14/22 284 lb (128.8 kg)    Physical Exam   Gen: WDWN NAD HEENT: NCAT, conjunctiva not injected, sclera nonicteric   mult skin tags upper eyelids CARDIAC: RRR, S1S2+, no murmur.  MSK: L elbow bursa-red, warm, enlarged  NEURO: A&O x3.  CN II-XII intact.  PSYCH: normal mood. Good eye contact  Reviewed UC records    Assessment & Plan:  Traumatic bursitis  Essential hypertension  Multiple skin tags of eyelids  Assessment and Plan Assessment & Plan Left olecranon bursitis   Recurrent swelling and warmth of the left olecranon bursa  The initial injury occurred six weeks ago after a fall onto the left elbow, with X-rays negative for fracture. Swelling initially subsided but has recurred with increased size and warmth. Refer to [facility] walk-in clinic for evaluation and possible drainage. Advised we don't do that here.  Offered other tx, but preference(I agree) to see ortho  Essential hypertension   Essential hypertension is managed with valsartan , but home blood pressure readings remain high, indicating suboptimal control. Recent in-office blood pressure was 138/82 mmHg. Weight gain may be contributing to poor control. Follow up with Dr. Katrinka for blood pressure management and medication adjustment. Schedule an annual physical examination. Reviewed mult ov bp's w/pt-can be normal or  elevated  Gout   Gout is managed with allopurinol . No acute gout flare reported at this visit.  Periocular skin tags   Periocular skin tags are becoming bothersome. Dermatologists have declined removal due to proximity to the eyes, suggesting referral to an plastics .    Return for soon hunter, annual physical/bp f/u.  Jenkins CHRISTELLA Carrel, MD

## 2024-07-13 NOTE — Telephone Encounter (Signed)
 FYI Only or Action Required?: FYI only for provider.  Patient was last seen in primary care on 01/13/2023 by Katrinka Garnette KIDD, MD.  Called Nurse Triage reporting Joint Swelling.  Symptoms began about a month ago.  Interventions attempted: Rest, hydration, or home remedies.  Symptoms are: gradually worsening.  Triage Disposition: See Physician Within 24 Hours  Patient/caregiver understands and will follow disposition?: Yes Reason for Disposition  [1] Painful joint AND [2] no fever  Answer Assessment - Initial Assessment Questions Jonathon [name] a month ago, hit elbow on the floor. Went to UC, xrays negative was told swelling should go down, but swelling is still there. Fluid on the elbow, difficult to bend, painful, swollen part is hot, red.  1. LOCATION: Where is the swelling? (e.g., left, right, both elbows)     Left elbow  2. SIZE and DESCRIPTION: What does the swelling look like? (e.g., entire elbow, localized)     Golf ball size  3. ONSET: When did the swelling start? Does it come and go, or is it there all the time?     Constant   4. ASSOCIATED SYMPTOMS: Is there any pain or redness?     Red, swollen, painful to touch and bend the elbow.  5. OTHER SYMPTOMS: Do you have any other symptoms? (e.g., fever)     No  Protocols used: Elbow Swelling-A-AH  Copied from CRM #[id number]. Topic: Clinical - Red Word Triage >> Jul 13, 2024  9:57 AM Jonathon [name] wrote: Red Word that prompted transfer to Nurse Triage: Patient has a hugh red knot on his left elbow that feels hot to the touch and painful

## 2024-07-13 NOTE — Patient Instructions (Signed)
 It was very nice to see you today!  [facility]   PLEASE NOTE:  If you had any lab tests please let us  know if you have not heard back within a few days. You may see your results on MyChart before we have a chance to review them but we will give you a call once they are reviewed by us . If we ordered any referrals today, please let us  know if you have not heard from their office within the next week.   Please try these tips to maintain a healthy lifestyle:  Eat most of your calories during the day when you are active. Eliminate processed foods including packaged sweets (pies, cakes, cookies), reduce intake of potatoes, white bread, white pasta, and white rice. Look for whole grain options, oat flour or almond flour.  Each meal should contain half fruits/vegetables, one quarter protein, and one quarter carbs (no bigger than a computer mouse).  Cut down on sweet beverages. This includes juice, soda, and sweet tea. Also watch fruit intake, though this is a healthier sweet option, it still contains natural sugar! Limit to 3 servings daily.  Drink at least 1 glass of water  with each meal and aim for at least 8 glasses per day  Exercise at least 150 minutes every week.

## 2024-07-13 NOTE — Telephone Encounter (Signed)
 Appt today

## 2024-07-16 ENCOUNTER — Other Ambulatory Visit: Payer: Self-pay | Admitting: Family Medicine

## 2024-07-27 ENCOUNTER — Ambulatory Visit (INDEPENDENT_AMBULATORY_CARE_PROVIDER_SITE_OTHER)

## 2024-07-27 VITALS — BP 191/114 | HR 84 | Ht 70.0 in | Wt 317.0 lb

## 2024-07-27 DIAGNOSIS — L918 Other hypertrophic disorders of the skin: Secondary | ICD-10-CM | POA: Diagnosis not present

## 2024-07-27 NOTE — Progress Notes (Signed)
 NAME: Jonathon [name]  MRN: [id number]  DOB: June 21, 1966    Referring physician:??Katrinka Garnette KIDD, MD  PCP:?Hunter, Garnette KIDD, MD    CHIEF COMPLAINT:?Eyelid skin tags   HPI:?  This is a 58 y.o. year old male with history of skin tags who presents in consultation for excision of bilateral upper and lower lid skin tags.   Specifically, the patient is most bothered by the appearance of his upper and lower eyelids due to skin tags. He is bothered by them and has problems with vision associated to them.     Prior refractive surgery? Yes, Lasix > 6 months ago   Use of neurotoxin/fillers? Denies   Patient denies thyroid conditions. On anticoagulation for DVT.     Glaucoma well controlled per patient. HTN today not well controlled.   Family History:   Family history is negative for bleeding/clotting disorders, problems with anesthesia, connective tissue disorders.     Social History:   Social History   Socioeconomic History   Marital status: Divorced    Spouse name: Not on file   Number of children: Not on file   Years of education: Not on file   Highest education level: Not on file  Occupational History   Not on file  Tobacco Use   Smoking status: Never   Smokeless tobacco: Never  Vaping Use   Vaping status: Never Used  Substance and Sexual Activity   Alcohol use: Yes    Alcohol/week: 5.0 - 6.0 standard drinks of alcohol    Types: 5 - 6 Cans of beer per week    Comment: weekly   Drug use: No   Sexual activity: Not on file  Other Topics Concern   Not on file  Social History Narrative   Long term girlfriend. Divorced. Daughter Nat (my patient) lives in [place]. Son Stewart in [place] in 2024 working hvac.       [employer] for [employer].    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)   Social Drivers of Health   Financial Resource Strain: Not on file  Food Insecurity: Not on file  Transportation Needs: Not on file  Physical  Activity: Not on file  Stress: Not on file  Social Connections: Unknown (02/24/2022)   Received from [facility]   Social Network    Social Network: Not on file    Smoker/Vape Denies  Recreational Drug use: Denies   PMH:  Past Medical History:  Diagnosis Date   Cancer [facility])    prostate   Chicken pox    Gout    Hypertension    Left leg DVT (HCC) 07/30/2016   Peripheral vascular disease    Pulmonary embolism (HCC) 07/18/2016   Sleep apnea    no CPAP      PSH:  Past Surgical History:  Procedure Laterality Date   APPENDECTOMY  2003   CHOLECYSTECTOMY  2002   COLONOSCOPY     HERNIA REPAIR     as child- unsure location   LASIK     similar to but not lasik per patient   LYMPHADENECTOMY Bilateral 08/01/2021   Procedure: LYMPHADENECTOMY, PELVIC;  Surgeon: Renda Glance, MD;  Location: [facility];  Service: Urology;  Laterality: Bilateral;   NASAL SEPTUM SURGERY  1996   ROBOT ASSISTED LAPAROSCOPIC RADICAL PROSTATECTOMY N/A 08/01/2021   Procedure: XI ROBOTIC ASSISTED LAPAROSCOPIC RADICAL PROSTATECTOMY LEVEL 2;  Surgeon: Renda Glance, MD;  Location: [facility];  Service: Urology;  Laterality: N/A;   WISDOM  TOOTH EXTRACTION        MEDICATIONS:?   Current Outpatient Medications:    allopurinol  (ZYLOPRIM ) 300 MG tablet, TAKE 1 TABLET BY MOUTH EVERY DAY, Disp: 90 tablet, Rfl: 0   cyclobenzaprine  (FLEXERIL ) 5 MG tablet, TAKE 1 TABLET (5 MG TOTAL) BY MOUTH 3 (THREE) TIMES DAILY AS NEEDED FOR MUSCLE SPASMS (IN THE LOW BACK. DO NOT DRIVE FOR 8 HOURS AFTER TAKING)., Disp: 30 tablet, Rfl: 1   sildenafil (VIAGRA) 100 MG tablet, TAKE AS DIRECTED AS NEEDED, Disp: , Rfl:    valsartan  (DIOVAN ) 80 MG tablet, TAKE 1 TABLET BY MOUTH EVERY DAY, Disp: 90 tablet, Rfl: 1   XARELTO  20 MG TABS tablet, TAKE 1 TABLET BY MOUTH DAILY WITH SUPPER., Disp: 90 tablet, Rfl: 1   colchicine  0.6 MG tablet, Take 2 tablets at first sign of gout flare repeat in 1 hour if not improved (max 3 in first day) then max 1 per  day on following days until resolved (Patient not taking: Reported on 07/27/2024), Disp: 30 tablet, Rfl: 1   mupirocin  ointment (BACTROBAN ) 2 %, APPLY TWICE DAILY AS NEEDED TO BELLY BUTTON (Patient not taking: Reported on 07/27/2024), Disp: 22 g, Rfl: 1    ALLERGIES:?  has no known allergies.    REVIEW OF SYSTEMS:  Review of Systems  ROS negative except as noted in HPI     VITALS:  Blood pressure (!) 191/114, pulse 84, height 5' 10 (1.778 m), weight (!) 317 lb (143.8 kg), SpO2 92%.   BP (!) 191/114   Pulse 84   Ht 5' 10 (1.778 m)   Wt (!) 317 lb (143.8 kg)   SpO2 92%   BMI 45.48 kg/m   Body mass index is 45.48 kg/m.  General: Well appearing, no apparent distress.  HEENT: Normocephalic, atraumatic, PERRL. Overall aged appearance to periorbital region.  -Upper lid: Dermatochalasis of upper lid with excess skin covering lid crease. Multiple skin tags bilaterally.  -Lower lid: Moderate amount of excess skin. Presence of orbital fat prolapse. Normal Bell's phenomenon. Multiple bilateral skin tags.      ASSESSMENT/PLAN  Assessment & Plan     The risks, benefits, goals and alternatives of skin tag excision were explained to the patient in detail as follows. The surgery is elective. After surgery it is sometimes the eyelids will not close very well and it is imperative that the patient uses the eye medications as prescribed to protect the eye. The risk of bleeding has been stressed and the patient understands that this can lead to blindness, hence the importance of letting us  know if any swelling on one side usually develops or gets worse and or she develops pain and vision issues, even blindness., Other potentially functional risks discussed include dry eyes, cornea or other eye injury, inability to completely close the eyes, ectropion (rolled out, droopy lower lid), blindness, and need for additional surgery at additional expense. Infection and scarring are not common but can happen.  Aesthetic risks or dissatisfaction can arise from asymmetry, and change in the shape of the eyelid that is possible after skin tag excision. Asymmetry is quite common in every person and sometimes surgery can unmask this or accentuate this.   We did discuss surgery and the risks related such as DVT/PE, infection, bleeding, organ injury, and other cardiovascular events. These are unexpected but the risk thereof can never be 0%.     The patient has a good understanding of all the risks and benefits, postoperative course and care.  We obtained pictures. All questions were answered.     The plan agreed upon includes: Excision of skin tags of upper and lower eyelids in the office. Needs PCP to control his blood pressure and to hold Xarelto  2 days before and two days after surgery. Instructed to go ASAP to urgent care or PCP to check his blood pressure.   I spent 30 minutes counseling the patient and discussing plan of care.  Jaydis Duchene M April Carlyon

## 2024-08-09 ENCOUNTER — Encounter: Payer: Self-pay | Admitting: Family Medicine

## 2024-08-09 ENCOUNTER — Ambulatory Visit: Payer: Self-pay | Admitting: Family Medicine

## 2024-08-09 ENCOUNTER — Ambulatory Visit (INDEPENDENT_AMBULATORY_CARE_PROVIDER_SITE_OTHER): Admitting: Family Medicine

## 2024-08-09 ENCOUNTER — Encounter: Admitting: Family Medicine

## 2024-08-09 VITALS — BP 134/90 | HR 92 | Temp 97.9°F | Ht 70.0 in | Wt 317.4 lb

## 2024-08-09 DIAGNOSIS — Z0000 Encounter for general adult medical examination without abnormal findings: Secondary | ICD-10-CM | POA: Diagnosis not present

## 2024-08-09 DIAGNOSIS — Z131 Encounter for screening for diabetes mellitus: Secondary | ICD-10-CM

## 2024-08-09 DIAGNOSIS — M1009 Idiopathic gout, multiple sites: Secondary | ICD-10-CM | POA: Diagnosis not present

## 2024-08-09 DIAGNOSIS — I10 Essential (primary) hypertension: Secondary | ICD-10-CM

## 2024-08-09 DIAGNOSIS — I82519 Chronic embolism and thrombosis of unspecified femoral vein: Secondary | ICD-10-CM

## 2024-08-09 LAB — COMPREHENSIVE METABOLIC PANEL WITH GFR
ALT: 19 U/L (ref 0–53)
AST: 16 U/L (ref 0–37)
Albumin: 4.3 g/dL (ref 3.5–5.2)
Alkaline Phosphatase: 112 U/L (ref 39–117)
BUN: 11 mg/dL (ref 6–23)
CO2: 27 meq/L (ref 19–32)
Calcium: 8.9 mg/dL (ref 8.4–10.5)
Chloride: 103 meq/L (ref 96–112)
Creatinine, Ser: 0.94 mg/dL (ref 0.40–1.50)
GFR: 89.51 mL/min (ref >60.00)
Glucose, Bld: 96 mg/dL (ref 70–99)
Potassium: 4.1 meq/L (ref 3.5–5.1)
Sodium: 139 meq/L (ref 135–145)
Total Bilirubin: 0.5 mg/dL (ref 0.2–1.2)
Total Protein: 6.8 g/dL (ref 6.0–8.3)

## 2024-08-09 LAB — CBC WITH DIFFERENTIAL/PLATELET
Basophils Absolute: 0 K/uL (ref 0.0–0.1)
Basophils Relative: 0.4 % (ref 0.0–3.0)
Eosinophils Absolute: 0.3 K/uL (ref 0.0–0.7)
Eosinophils Relative: 6.2 % — ABNORMAL HIGH (ref 0.0–5.0)
HCT: 43.7 % (ref 39.0–52.0)
Hemoglobin: 14.3 g/dL (ref 13.0–17.0)
Lymphocytes Relative: 25 % (ref 12.0–46.0)
Lymphs Abs: 1.3 K/uL (ref 0.7–4.0)
MCHC: 32.7 g/dL (ref 30.0–36.0)
MCV: 85.3 fl (ref 78.0–100.0)
Monocytes Absolute: 0.6 K/uL (ref 0.1–1.0)
Monocytes Relative: 10.6 % (ref 3.0–12.0)
Neutro Abs: 3.1 K/uL (ref 1.4–7.7)
Neutrophils Relative %: 57.8 % (ref 43.0–77.0)
Platelets: 209 K/uL (ref 150.0–400.0)
RBC: 5.12 Mil/uL (ref 4.22–5.81)
RDW: 15.4 % (ref 11.5–15.5)
WBC: 5.3 K/uL (ref 4.0–10.5)

## 2024-08-09 LAB — LIPID PANEL
Cholesterol: 156 mg/dL (ref 0–200)
HDL: 46.4 mg/dL (ref >39.00)
LDL Cholesterol: 79 mg/dL (ref 0–99)
NonHDL: 109.24
Total CHOL/HDL Ratio: 3
Triglycerides: 153 mg/dL — ABNORMAL HIGH (ref 0.0–149.0)
VLDL: 30.6 mg/dL (ref 0.0–40.0)

## 2024-08-09 LAB — HEMOGLOBIN A1C: Hgb A1c MFr Bld: 6.3 % (ref 4.6–6.5)

## 2024-08-09 LAB — URIC ACID: Uric Acid, Serum: 6.6 mg/dL (ref 4.0–7.8)

## 2024-08-09 NOTE — Progress Notes (Signed)
 Phone: [phone]   Subjective:  Patient presents today for their annual physical. Chief complaint-noted.   See problem oriented charting- ROS- full  review of systems was completed and negative  except for topics noted under acute/chronic concerns  The following were reviewed and entered/updated in epic: Past Medical History:  Diagnosis Date   Cancer (HCC)    prostate   Chicken pox    Gout    Hypertension    Left leg DVT (HCC) 07/30/2016   Peripheral vascular disease    Pulmonary embolism (HCC) 07/18/2016   Sleep apnea    no CPAP   Patient Active Problem List   Diagnosis Date Noted   Malignant neoplasm of prostate (HCC) 04/23/2021    Priority: High   Chronic deep vein thrombosis (DVT) (HCC) 07/30/2016    Priority: High   History of pulmonary embolism 07/18/2016    Priority: High   Gout 07/30/2016    Priority: Medium    OSA (obstructive sleep apnea) 07/30/2016    Priority: Medium    Essential hypertension 07/30/2016    Priority: Medium    Traumatic bursitis 11/12/2017    Priority: Low   Past Surgical History:  Procedure Laterality Date   APPENDECTOMY  2003   CHOLECYSTECTOMY  2002   COLONOSCOPY     HERNIA REPAIR     as child- unsure location   LASIK     similar to but not lasik per patient   LYMPHADENECTOMY Bilateral 08/01/2021   Procedure: LYMPHADENECTOMY, PELVIC;  Surgeon: Renda Glance, MD;  [facility];  Service: Urology;  Laterality: Bilateral;   NASAL SEPTUM SURGERY  1996   ROBOT ASSISTED LAPAROSCOPIC RADICAL PROSTATECTOMY N/A 08/01/2021   Procedure: XI ROBOTIC ASSISTED LAPAROSCOPIC RADICAL PROSTATECTOMY LEVEL 2;  Surgeon: Renda Glance, MD;  Location[facility];  Service: Urology;  Laterality: N/A;   WISDOM TOOTH EXTRACTION      Family History  Problem Relation Age of Onset   Heart attack Father 60   Hypercalcemia Father    Diabetes Father    Hypertension Father    Uterine cancer Mother    Stroke Paternal Uncle    Alcohol abuse Paternal  Grandfather    Other Sister        8 years older   Brain cancer Brother        glioblastoma- 10 years older   Other Brother        20 years older   Colon cancer Neg Hx    Colon polyps Neg Hx    Esophageal cancer Neg Hx    Rectal cancer Neg Hx    Stomach cancer Neg Hx     Medications- reviewed and updated Current Outpatient Medications  Medication Sig Dispense Refill   allopurinol  (ZYLOPRIM ) 300 MG tablet TAKE 1 TABLET BY MOUTH EVERY DAY 90 tablet 0   cyclobenzaprine  (FLEXERIL ) 5 MG tablet TAKE 1 TABLET (5 MG TOTAL) BY MOUTH 3 (THREE) TIMES DAILY AS NEEDED FOR MUSCLE SPASMS (IN THE LOW BACK. DO NOT DRIVE FOR 8 HOURS AFTER TAKING). 30 tablet 1   sildenafil (VIAGRA) 100 MG tablet TAKE AS DIRECTED AS NEEDED     valsartan  (DIOVAN ) 80 MG tablet TAKE 1 TABLET BY MOUTH EVERY DAY 90 tablet 1   XARELTO  20 MG TABS tablet TAKE 1 TABLET BY MOUTH DAILY WITH SUPPER. 90 tablet 1   colchicine  0.6 MG tablet Take 2 tablets at first sign of gout flare repeat in 1 hour if not improved (max 3 in first day) then  max 1 per day on following days until resolved (Patient not taking: Reported on 08/09/2024) 30 tablet 1   mupirocin  ointment (BACTROBAN ) 2 % APPLY TWICE DAILY AS NEEDED TO BELLY BUTTON (Patient not taking: Reported on 08/09/2024) 22 g 1   No current facility-administered medications for this visit.    Allergies-reviewed and updated No Known Allergies  Social History   Social History Narrative   Long term girlfriend. Divorced. Daughter Nat (my patient) lives in [place]. Son Montegut in [place] in 2024 working hvac.       [employer] for [employer].    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 Oropharynx normal other than  small white lesion roof of mouth dentist watching and considering biopsy Neck: no thyromegaly CV: RRR no murmurs rubs or gallops Lungs: CTAB no crackles, wheeze, rhonchi Abdomen: soft/nontender/nondistended/normal bowel sounds. No rebound or guarding.  Ext: no edema Skin: warm, dry Neuro: grossly normal, moves all extremities, PERRLA   Assessment and Plan  58 y.o. male presenting for annual physical.  Health Maintenance counseling: 1. Anticipatory guidance: Patient counseled regarding regular dental exams -q6 months, eye exams - in January yearly,  avoiding smoking and second hand smoke , limiting alcohol to 2 beverages per day - occasional 2-3 a week, no illicit drugs .   2. Risk factor reduction:  Advised patient of need for regular exercise and diet rich and fruits and vegetables to reduce risk of heart attack and stroke.  Exercise- office now close to his gym and can restart- 2 days a week still but feels he can bump this up.  Diet/weight management-gained 23 pounds from last visit- discussed reversing this trend- has a friend that he can work with/hold accountable to each other. Feels could reduce nighttime snacking- has calorie counted in past.  Wt Readings from Last 3 Encounters:  08/09/24 (!) 317 lb 6.4 oz (144 kg)  07/27/24 (!) 317 lb (143.8 kg)  07/13/24 (!) 312 lb (141.5 kg)  3. Immunizations/screenings/ancillary studies- declines flu and Prevnar. Low risk hepatitis B Immunization History  Administered Date(s) Administered   PFIZER(Purple Top)SARS-COV-2 Vaccination 01/05/2020, 01/30/2020, 09/26/2020   Tdap 02/05/2017   Zoster Recombinant(Shingrix) 06/18/2021, 11/07/2021  4. Prostate cancer screening- close follow-up with urology for history of prostate cancer 5.  Colon cancer screening - History of colon polyps-followed by Dr. Armbruster-07/07/2022 The five polyps that I removed from your colon were proven to be adenomatous and sessile serrated... next colonoscopy in 3 years.   6. Skin cancer screening-seen in 2023 and was advised no regular follow-up needed. advised regular sunscreen use. Denies worrisome, changing, or new skin lesions.  7. Smoking associated screening (lung cancer screening, AAA screen 65-75, UA)-never smoker 8. STD screening - monogamous with long-term girlfriend  Status of chronic or acute concerns   #olecranon bursitis- happened after trauma- had to get it drained but minimal amount- still some lingering swelling in area but not painful other than with some weights. Worse with push.   #skin tag- removal planned with plastics for face- also has one on left upper leg they may look at  -will hold xarelto  for 2 days prior to procedure- I'm  ok with that if they reach out.   #Prostate cancer S:  robotic assisted prostatectomy in October  2022h with Dr. Renda.  He was referred due to with elevated PSA-had been [facility] at [facility] and was originally diagnosed 04/17/2021 A/P: reports reassuring check ups with Dr. Lawton. They will check next PSA for him    # Essential Hypertension S: medication: valsartan  80mg  mg  -has had some weight gaing -prior chlorthalidone  25 mg and amlodipine  10 mg daily  BP Readings from Last 3 Encounters:  08/09/24 (!) 134/90  07/27/24 (!) 191/114  07/13/24 138/82  A/P: blood pressure above goal- he has enough pills to trial 2 of the valsartan  80 mg for 1-2 weeks and update me- I can send in higher prescription if that is working- otherwise e may need to consider even higher dose or restarting prior chlorthalidone  or amlodipine - also encouraged regular exercise and DASH eating plan  #hyperlipidemia- no CT calcium on CT angiogram S: Medication:none  Lab Results  Component Value Date   CHOL 165 01/13/2023   HDL 40.30 01/13/2023   LDLCALC 93 01/13/2023   LDLDIRECT 105.0 12/17/2020   TRIG 155.0 (H) 01/13/2023   CHOLHDL 4 01/13/2023   A/P: very mild elevations- reports not fasting last visit so safe to recheck today without  concern for triglyceride(s) near 400 The 10-year ASCVD risk score (Arnett DK, et al., 2019) is: 9% - he is open to CT calcium if risk still above 7.5%- blood pressure driving risk up some  #Gout S: medication: allopurinol  300 mg in the AM daily. No gout flares  -Has colchiciine on hand but thankfully not needing Lab Results  Component Value Date   LABURIC 6.1 01/13/2023  A/P:hopefully stable uric acid- update uric acid today. Continue current meds for now   -so close to goal without flares that I would not treat   # Chronic DVT/PE-  S: Patient with history of chronic popliteal thrombosis as well as DVT/PE-used compression stockings with travel by airplane or driving over 2 hours.  On aspirin alone per Dr. Timmy who had released patient but he later had recurrence so we opted for lifelong therapy (05/19/22 minimal nonocculisive chronic thrombus in distal femoral and popliteal veins) A/P: chronic DVT/Pulmonary embolism- remain on long term xarelto    Recommended follow up: Return in about 1 month (around 09/09/2024) for followup or sooner if needed.Schedule b4 you leave. Future Appointments  Date Time Provider Department Center  09/02/2024 10:20 AM Montorfano, Nieves HERO, MD [facility] None    Lab/Order associations:NOT fasting   ICD-10-CM   1. Preventative health care  Z00.00     2. Essential hypertension  I10 Comprehensive metabolic panel with GFR    Lipid panel    CBC with Differential/Platelet    3. Idiopathic gout of multiple sites, unspecified chronicity  M10.09 Uric acid    4. Screening for diabetes mellitus  Z13.1 Hemoglobin A1c    5. Morbid obesity (HCC)  E66.01 Hemoglobin A1c    6. Chronic deep vein thrombosis (DVT) of femoral vein, unspecified laterality (HCC)  I82.519       No orders of the defined types were placed in this encounter.   Return precautions advised.  Garnette Lukes, MD

## 2024-08-09 NOTE — Patient Instructions (Addendum)
 blood pressure above goal- he has enough pills to trial 2 of the valsartan  80 mg for 1-2 weeks and update me- I can send in higher prescription if that is working- otherwise e may need to consider even higher dose or restarting prior chlorthalidone  or amlodipine - also encouraged regular exercise and DASH eating plan -upper arm cuff if possible would be more ideal  I suggest myfitnesspal or your spreadsheet Use 0.5 pounds per week weight loss goal (or up to 1 pounds if needed) Set a reasonable goal such as 5-10 lbs and can reset goal once you reach the goal Do not connect your step counter to this- watch or phone Update me in 2-3 months with how you are doing  Great job trying to bump exercise up  Please stop by lab before you go If you have mychart- we will send your results within 3 business days of us  receiving them.  If you do not have mychart- we will call you about results within 5 business days of us  receiving them.  *please also note that you will see labs on mychart as soon as they post. I will later go in and write notes on them- will say notes from Dr. Katrinka   Recommended follow up: Return in about 1 month (around 09/09/2024) for followup or sooner if needed.Schedule b4 you leave.

## 2024-08-12 ENCOUNTER — Other Ambulatory Visit: Payer: Self-pay | Admitting: Family Medicine

## 2024-08-19 ENCOUNTER — Encounter: Admitting: Family Medicine

## 2024-09-02 ENCOUNTER — Other Ambulatory Visit (HOSPITAL_COMMUNITY): Admission: RE | Admit: 2024-09-02 | Discharge: 2024-09-02 | Disposition: A | Source: Ambulatory Visit

## 2024-09-02 ENCOUNTER — Ambulatory Visit

## 2024-09-02 VITALS — BP 157/90 | HR 107

## 2024-09-02 DIAGNOSIS — L821 Other seborrheic keratosis: Secondary | ICD-10-CM | POA: Diagnosis not present

## 2024-09-02 DIAGNOSIS — L918 Other hypertrophic disorders of the skin: Secondary | ICD-10-CM | POA: Insufficient documentation

## 2024-09-02 NOTE — Progress Notes (Signed)
 PROCEDURE NOTE PLASTIC SURGERY PROCEDURE NOTE   Procedure: Excision of skin tags of bilatral upper eyelids x 6 and right thigh x 1   Diagnosis: Skin tags   Surgeon: Nieves HERO. Zenith Kercheval, MD   Anesthesia: 1% lidocaine  with epinephrine  1:100,000 Mixed with 0.25% Marcaine  50/50 dilution (total  5 mL)   Complications: None   Specimen: Skin tags, sent to phatology    DESCRIPTION OF THE PROCEDURE IN DETAIL:   We discussed the risks benefits and alternatives to lesion excision. We discussed the alternatives which would be observation of the lesion. We discussed the benefits of excision which include removal of the lesion and the ability to examine it under a microscope to rule out the presence of a malignancy. We also discussed the benefits of removing the lesion from a symptomatic perspective. We discussed the risks of surgery, including but not limited to infection, bleeding, damage to the surrounding healthy tissues, visual issues, and the need for further surgery. We also discussed the risks of wound separation, poor scarring and lesion recurrence. We also discussed the risks of anesthesia. The patient again voiced their understanding of the above and once again confirmed informed consent.   The patient was identified by name, date of birth and medical record number. The patient's forehead was prepped and draped in the standard sterile fashion. Timeout was conducted and preoperative instrument and needle counts were correct.    Local anesthesia was injected.  I focused first on the right upper eyelid, where I excised 2 skin tags sharply with a scissor hemostasis was achieved and patient was closed with 5-0 fast gut.  Then my attention was focused on the left upper eyelid where 4 skin tags were sharply excised with scissors hemostasis was achieved and patient was closed with 5-0 fast gut.  The last part of the procedure involved sharp excision with scissors of right thigh skin tag, hemostasis was  achieved wound was closed with 4-0 Monocryl and Dermabond.  The masses were sent as a pathological specimens. The rest of the incisions were dressed with antibiotic ointment.    The patient tolerated the procedure well. There were no immediate complications. Postoperative instrument and needle counts were correct.    Postoperative care discussed, return to clinic in 14 days.   Deiondre Harrower M. Erikson Danzy, MD [facility]

## 2024-09-06 LAB — SURGICAL PATHOLOGY

## 2024-09-15 ENCOUNTER — Encounter: Payer: Self-pay | Admitting: Family Medicine

## 2024-09-15 ENCOUNTER — Ambulatory Visit: Admitting: Family Medicine

## 2024-09-15 VITALS — BP 138/84 | HR 84 | Temp 97.2°F | Ht 70.0 in | Wt 303.2 lb

## 2024-09-15 DIAGNOSIS — I10 Essential (primary) hypertension: Secondary | ICD-10-CM | POA: Diagnosis not present

## 2024-09-15 MED ORDER — VALSARTAN 160 MG PO TABS: 160.0000 mg | ORAL_TABLET | Freq: Every day | ORAL | 3 refills | Status: AC

## 2024-09-15 NOTE — Progress Notes (Signed)
 Phone [phone] In person visit   Subjective:   YUNIOR JAIN is a 58 y.o. year old very pleasant male patient who presents for/with See problem oriented charting Chief Complaint  Patient presents with   Follow-up   Hypertension    Her for a 1 month blood pressure follow-up post med change. Has a few reading. 161/105, P125 (oct 29) 159/106, P100 (nov 7) 166/101, P87 (nov16) 165/93, P91 (nov 16) 150/103, P95 (nov 21) 143/110, P96(dec 1) 166/107, P92 (today). All taken with a wrist monitor.     Past Medical History-  Patient Active Problem List   Diagnosis Date Noted   Malignant neoplasm of prostate (HCC) 04/23/2021    Priority: High   Chronic deep vein thrombosis (DVT) (HCC) 07/30/2016    Priority: High   History of pulmonary embolism 07/18/2016    Priority: High   Gout 07/30/2016    Priority: Medium    OSA (obstructive sleep apnea) 07/30/2016    Priority: Medium    Essential hypertension 07/30/2016    Priority: Medium    Traumatic bursitis 11/12/2017    Priority: Low    Medications- reviewed and updated Current Outpatient Medications  Medication Sig Dispense Refill   allopurinol  (ZYLOPRIM ) 300 MG tablet TAKE 1 TABLET BY MOUTH EVERY DAY 90 tablet 0   colchicine  0.6 MG tablet Take 2 tablets at first sign of gout flare repeat in 1 hour if not improved (max 3 in first day) then max 1 per day on following days until resolved 30 tablet 1   cyclobenzaprine  (FLEXERIL ) 5 MG tablet TAKE 1 TABLET (5 MG TOTAL) BY MOUTH 3 (THREE) TIMES DAILY AS NEEDED FOR MUSCLE SPASMS (IN THE LOW BACK. DO NOT DRIVE FOR 8 HOURS AFTER TAKING). 30 tablet 1   mupirocin  ointment (BACTROBAN ) 2 % APPLY TWICE DAILY AS NEEDED TO BELLY BUTTON (Patient taking differently: as needed. APPLY TWICE DAILY AS NEEDED TO BELLY BUTTON) 22 g 1   sildenafil (VIAGRA) 100 MG tablet TAKE AS DIRECTED AS NEEDED     XARELTO  20 MG TABS tablet TAKE 1 TABLET BY MOUTH DAILY WITH SUPPER. 90 tablet 1   valsartan  (DIOVAN ) 160  MG tablet Take 1 tablet (160 mg total) by mouth daily. 90 tablet 3   No current facility-administered medications for this visit.     Objective:  BP 138/84   Pulse 84   Temp (!) 97.2 F (36.2 C) (Temporal)   Ht 5' 10 (1.778 m)   Wt (!) 303 lb 3.2 oz (137.5 kg)   SpO2 95%   BMI 43.50 kg/m  Gen: NAD, resting comfortably CV: RRR no murmurs rubs or gallops Lungs: CTAB no crackles, wheeze, rhonchi Ext: trace edema Skin: warm, dry     Assessment and Plan    # Essential Hypertension S: medication: valsartan  160mg  mg  -down 14 lbs from last visit! Buckled back down on what he was eating and exercising more. May skip a meal here or there and eats just egg white omelets for breakfast -prior chlorthalidone  25and amlodipine  10 mg daily Home readings #s: wrist cuff usually 140's or 160's over 90's to 100s BP Readings from Last 3 Encounters:  09/15/24 138/84  09/02/24 (!) 157/90  08/09/24 (!) 134/90  A/P: Blood pressure drastically improved in office- lets have you continue the valsartan  160 mg- wed still like to see the pressure lower in the long run but with your weight loss/exercise/healthier eating suspect this will trend down further so we did not increase any medication  today -At baseline has some trace edema so hoping to avoid amlodipine  if possible   Recommended follow up: Return in about 5 months (around 02/13/2025) for followup or sooner if needed.Schedule b4 you leave. Future Appointments  Date Time Provider Department Center  09/19/2024  9:40 AM Andris Estefana BRAVO, PA-C [facility] None  11/23/2024  8:30 AM Montorfano, Nieves HERO, MD [facility] None  02/14/2025  8:20 AM Katrinka Garnette KIDD, MD [facility]    Lab/Order associations:   ICD-10-CM   1. Essential hypertension  I10       Meds ordered this encounter  Medications   valsartan  (DIOVAN ) 160 MG tablet    Sig: Take 1 tablet (160 mg total) by mouth daily.    Dispense:  90 tablet    Refill:  3    Return  precautions advised.  Garnette Katrinka, MD

## 2024-09-15 NOTE — Patient Instructions (Addendum)
 Blood pressure drastically improved in office- lets have you continue the valsartan  160 mg- wed still like to see the pressure lower in the long run but with your weight loss/exercise/healthier eating suspect this will trend down further so we did not increase any medication today  Recommended follow up: Return in about 5 months (around 02/13/2025) for followup or sooner if needed.Schedule b4 you leave.  BUT please update me in about 6 weeks with home blood pressure readings- depending on trajectory we may see you sooner

## 2024-09-19 ENCOUNTER — Ambulatory Visit: Admitting: Student

## 2024-09-19 ENCOUNTER — Encounter: Payer: Self-pay | Admitting: Student

## 2024-09-19 VITALS — BP 162/98 | HR 65 | Wt 305.0 lb

## 2024-09-19 DIAGNOSIS — L918 Other hypertrophic disorders of the skin: Secondary | ICD-10-CM

## 2024-09-19 NOTE — Progress Notes (Signed)
 Patient is a 58 year old male who recently underwent excision of skin tags to the bilateral upper eyelids x 6 and underwent excision of 1 skin tag to the left thigh with Dr. Montorfano on 09/02/2024.  Per review of the procedure note, incisions to the  eyelid were closed with 5-0 fast gut and incision to the left thigh was closed with 4-0 Monocryl.  Patient is a little over 2 weeks out from his procedure.  He presents to the clinic today for postprocedural follow-up.  Lesions were sent to pathology.  Per chart review, pathology shows the following: A. SKIN TAG, LEFT UPPER EYELID, EXCISION:       Seborrheic keratosis.   B. SKIN TAG, LEFT UPPER EYELID, EXCISION:       Seborrheic keratosis.   C. SKIN TAG, LEFT UPPER EYELID, EXCISION:       Seborrheic keratosis.   D. SKIN TAG, LEFT UPPER EYELID, EXCISION:       Fibroepithelial polyp / skin tag.   E. SKIN TAG, RIGHT UPPER EYELID, EXCISION:       Seborrheic keratosis.   F. SKIN TAG, RIGHT UPPER EYELID, EXCISION:       Seborrheic keratosis.   G. SKIN TAG, LEFT UPPER THIGH, EXCISION:       Fibroepithelial polyp / skin tag.   Today, patient reports he is doing well.  He denies any issues or concerns with the procedure sites.  He denies any fevers or chills.  Discussed the results of the pathology with the patient.  He expressed understanding.  Chaperone present on exam.  On exam, patient is sitting upright in no acute distress.  Incisions to the eyelids appear to have completely healed.  There are no signs of infection.  To the left thigh, there is some superficial scabbing.  There was a small Monocryl suture noted as well.  This was cut and removed.  Patient tolerated well.  There is no surrounding erythema or drainage.  No signs of infection.  Patient's blood pressure is elevated in clinic today.  He denies any headaches, changes in his vision, chest pain or shortness of breath.  He states that his blood pressure has always been elevated and  he has been working with his PCP on it.  His blood pressure was taken again at the end of the visit and it did come down a little bit.  I recommended that he follow-up with his PCP in regards to this.  He expressed understanding.  Recommend that patient apply Vaseline to the left thigh daily for the next 1 to 2 weeks.  Discussed with him that after that, he may apply scar creams if he would like.  Discussed with the patient that he should avoid direct sunlight to the incision as this can worsen the scar.  Patient expressed understanding.  Patient to follow-up as needed.  Instructed him to call if he has any questions or concerns about anything.  Pictures were obtained of the patient and placed in the chart with the patient's or guardian's permission.

## 2024-11-10 ENCOUNTER — Other Ambulatory Visit: Payer: Self-pay | Admitting: Family Medicine

## 2024-11-23 ENCOUNTER — Ambulatory Visit

## 2025-02-14 ENCOUNTER — Ambulatory Visit: Admitting: Family Medicine
